# Patient Record
Sex: Male | Born: 1965 | Race: White | Hispanic: No | Smoking: Current every day smoker
Health system: Southern US, Community
[De-identification: ages and names within clinical notes are randomized; demographics above are authoritative.]

## PROBLEM LIST (undated history)

## (undated) DIAGNOSIS — I10 Essential (primary) hypertension: Secondary | ICD-10-CM

## (undated) DIAGNOSIS — E7800 Pure hypercholesterolemia, unspecified: Secondary | ICD-10-CM

---

## 2008-05-31 ENCOUNTER — Emergency Department (HOSPITAL_BASED_OUTPATIENT_CLINIC_OR_DEPARTMENT_OTHER): Admission: EM | Admit: 2008-05-31 | Discharge: 2008-05-31 | Payer: Self-pay | Admitting: Emergency Medicine

## 2008-06-02 ENCOUNTER — Emergency Department (HOSPITAL_BASED_OUTPATIENT_CLINIC_OR_DEPARTMENT_OTHER): Admission: EM | Admit: 2008-06-02 | Discharge: 2008-06-02 | Payer: Self-pay | Admitting: Emergency Medicine

## 2008-06-17 ENCOUNTER — Emergency Department (HOSPITAL_BASED_OUTPATIENT_CLINIC_OR_DEPARTMENT_OTHER): Admission: EM | Admit: 2008-06-17 | Discharge: 2008-06-17 | Payer: Self-pay | Admitting: Emergency Medicine

## 2008-09-23 ENCOUNTER — Ambulatory Visit: Payer: Self-pay | Admitting: Diagnostic Radiology

## 2008-09-23 ENCOUNTER — Emergency Department (HOSPITAL_BASED_OUTPATIENT_CLINIC_OR_DEPARTMENT_OTHER): Admission: EM | Admit: 2008-09-23 | Discharge: 2008-09-23 | Payer: Self-pay | Admitting: Emergency Medicine

## 2009-02-01 ENCOUNTER — Emergency Department (HOSPITAL_BASED_OUTPATIENT_CLINIC_OR_DEPARTMENT_OTHER): Admission: EM | Admit: 2009-02-01 | Discharge: 2009-02-01 | Payer: Self-pay | Admitting: Emergency Medicine

## 2009-02-01 ENCOUNTER — Ambulatory Visit: Payer: Self-pay | Admitting: Diagnostic Radiology

## 2009-06-12 ENCOUNTER — Emergency Department (HOSPITAL_BASED_OUTPATIENT_CLINIC_OR_DEPARTMENT_OTHER): Admission: EM | Admit: 2009-06-12 | Discharge: 2009-06-12 | Payer: Self-pay | Admitting: Emergency Medicine

## 2011-01-06 LAB — RAPID STREP SCREEN (MED CTR MEBANE ONLY): Streptococcus, Group A Screen (Direct): NEGATIVE

## 2011-07-03 LAB — RAPID STREP SCREEN (MED CTR MEBANE ONLY): Streptococcus, Group A Screen (Direct): NEGATIVE

## 2014-01-24 DIAGNOSIS — I10 Essential (primary) hypertension: Secondary | ICD-10-CM | POA: Insufficient documentation

## 2016-03-30 ENCOUNTER — Emergency Department
Admission: EM | Admit: 2016-03-30 | Discharge: 2016-03-30 | Disposition: A | Discharge status: Home / Self Care | Payer: Self-pay | Source: Home / Self Care | Attending: Family Medicine | Admitting: Family Medicine

## 2016-03-30 ENCOUNTER — Encounter: Payer: Self-pay | Admitting: Emergency Medicine

## 2016-03-30 DIAGNOSIS — L723 Sebaceous cyst: Secondary | ICD-10-CM

## 2016-03-30 HISTORY — DX: Essential (primary) hypertension: I10

## 2016-03-30 MED ORDER — DOXYCYCLINE HYCLATE 100 MG PO CAPS: 100.0000 mg | ORAL_CAPSULE | Freq: Two times a day (BID) | ORAL | Status: DC

## 2016-03-30 NOTE — ED Notes (Signed)
Cyst on back x 4 years, Patient had it removed 8 years ago.

## 2016-03-30 NOTE — ED Provider Notes (Signed)
CSN: [id number]     Arrival date & time 03/30/16  1439 History   First MD Initiated Contact with Patient 03/30/16 1507     Chief Complaint  Patient presents with  . Cyst      HPI Comments: Patient complains of a large nontender lump on his upper back.  He states that his girlfriend persuaded him to have it checked.  He states that he had the lesion surgically drained about four years ago but the lesion has recurred.  The history is provided by the patient.    Past Medical History  Diagnosis Date  . Hypertension    History reviewed. No pertinent past surgical history. No family history on file. Social History  Substance Use Topics  . Smoking status: Current Every Day Smoker -- 1.00 packs/day for 35 years    Types: Cigarettes  . Smokeless tobacco: None  . Alcohol Use: Yes    Review of Systems  Constitutional: Negative for fever, chills, diaphoresis, activity change, appetite change and fatigue.  Skin:       Lesion on upper back  All other systems reviewed and are negative.   Allergies  Review of patient's allergies indicates no known allergies.  Home Medications   Prior to Admission medications   Medication Sig Start Date End Date Taking? Authorizing Provider  doxycycline (VIBRAMYCIN) 100 MG capsule Take 1 capsule (100 mg total) by mouth 2 (two) times daily. 03/30/16   Kandra Nicolas, MD   Meds Ordered and Administered this Visit  Medications - No data to display  BP 127/82 mmHg  Pulse 80  Temp(Src) 98.3 F (36.8 C) (Oral)  Ht 6\' 2"  (1.88 m)  Wt 220 lb (99.791 kg)  BMI 28.23 kg/m2  SpO2 96% No data found.   Physical Exam  Constitutional: He is oriented to person, place, and time. He appears well-developed and well-nourished. No distress.  HENT:  Head: Normocephalic.  Mouth/Throat: Oropharynx is clear and moist.  Eyes: Pupils are equal, round, and reactive to light.  Neck: Neck supple.  Cardiovascular: Normal heart sounds.   Pulmonary/Chest: Breath sounds  normal.  Abdominal: There is no tenderness.  Musculoskeletal: He exhibits no edema.  Neurological: He is alert and oriented to person, place, and time.  Skin: Skin is warm and dry.     Upper back reveals a 4cm diameter sebaceous cyst  as noted on diagram.  The lesion is nontender, without overlying erythema, and does not appear to be infected.      ED Course  Procedures Incise and drain cyst Risks and benefits of procedure explained to patient and verbal consent obtained.  Using sterile technique and local anesthesia with 1% lidocaine with epinephrine, cleansed affected area with Betadine and saline. Identified the most fluctuant area of lesion and incised with #11 blade.  Expressed and extracted significant amount of sebaceous material.   Inserted Iodoform gauze packing.  Bandage applied.  Patient tolerated well    MDM   1. Sebaceous cyst    Although cyst does not appear infected, will begin empiric doxycycline 100mg  bid for staph coverage. Leave today's bandage in place until follow-up visit tomorrow.  Keep wound clean and dry.  After tomorrow, return for any signs of infection (or follow-up with family doctor):  Increasing redness, swelling, pain, heat, drainage, etc. For pain, may take Ibuprofen 200mg , 4 tabs every 8 hours with food.    Kandra Nicolas, MD 04/01/16 [phone]

## 2016-03-30 NOTE — Discharge Instructions (Signed)
Leave today's bandage in place until follow-up visit tomorrow.  Keep wound clean and dry.  After tomorrow, return for any signs of infection (or follow-up with family doctor):  Increasing redness, swelling, pain, heat, drainage, etc. For pain, may take Ibuprofen 200mg , 4 tabs every 8 hours with food.    Epidermal Cyst An epidermal cyst is sometimes called a sebaceous cyst, epidermal inclusion cyst, or infundibular cyst. These cysts usually contain a substance that looks "pasty" or "cheesy" and may have a bad smell. This substance is a protein called keratin. Epidermal cysts are usually found on the face, neck, or trunk. They may also occur in the vaginal area or other parts of the genitalia of both men and women. Epidermal cysts are usually small, painless, slow-growing bumps or lumps that move freely under the skin. It is important not to try to pop them. This may cause an infection and lead to tenderness and swelling. CAUSES  Epidermal cysts may be caused by a deep penetrating injury to the skin or a plugged hair follicle, often associated with acne. SYMPTOMS  Epidermal cysts can become inflamed and cause:  Redness.  Tenderness.  Increased temperature of the skin over the bumps or lumps.  Grayish-white, bad smelling material that drains from the bump or lump. DIAGNOSIS  Epidermal cysts are easily diagnosed by your caregiver during an exam. Rarely, a tissue sample (biopsy) may be taken to rule out other conditions that may resemble epidermal cysts. TREATMENT   Epidermal cysts often get better and disappear on their own. They are rarely ever cancerous.  If a cyst becomes infected, it may become inflamed and tender. This may require opening and draining the cyst. Treatment with antibiotics may be necessary. When the infection is gone, the cyst may be removed with minor surgery.  Small, inflamed cysts can often be treated with antibiotics or by injecting steroid medicines.  Sometimes,  epidermal cysts become large and bothersome. If this happens, surgical removal in your caregiver's office may be necessary. HOME CARE INSTRUCTIONS  Only take over-the-counter or prescription medicines as directed by your caregiver.  Take your antibiotics as directed. Finish them even if you start to feel better. SEEK MEDICAL CARE IF:   Your cyst becomes tender, red, or swollen.  Your condition is not improving or is getting worse.  You have any other questions or concerns. MAKE SURE YOU:  Understand these instructions.  Will watch your condition.  Will get help right away if you are not doing well or get worse.   This information is not intended to replace advice given to you by your health care provider. Make sure you discuss any questions you have with your health care provider.   Document Released: 08/19/2004 Document Revised: 12/11/2011 Document Reviewed: 03/27/2011 Elsevier Interactive Patient Education 2016 [place].   Sebaceous Cyst Removal, Care After Refer to this sheet in the next few weeks. These instructions provide you with information about caring for yourself after your procedure. Your health care provider may also give you more specific instructions. Your treatment has been planned according to current medical practices, but problems sometimes occur. Call your health care provider if you have any problems or questions after your procedure. WHAT TO EXPECT AFTER THE PROCEDURE After your procedure, it is common to have:  Soreness in the area where your cyst was removed.  Tightness or itching from your skin sutures. HOME CARE INSTRUCTIONS  Take medicines only as directed by your health care provider.  If you were  prescribed an antibiotic medicine, finish all of it even if you start to feel better.  Use antibiotic ointment as directed by your health care provider. Follow the instructions carefully.  There are many different ways to close and cover an  incision, including stitches (sutures), skin glue, and adhesive strips. Follow your health care provider's instructions about:  Incision care.  Bandage (dressing) changes and removal.  Incision closure removal.  Keep the bandage (dressing) dry until your health care provider says that it can be removed. Take sponge baths only. Ask your health care provider when you can start showering or taking a bath.  After your dressing is off, check your incision every day for signs of infection. Watch for:  Redness, swelling, or pain.  Fluid, blood, or pus.  You can return to your normal activities. Do not do anything that stretches or puts pressure on your incision.  You can return to your normal diet.  Keep all follow-up visits as directed by your health care provider. This is important. SEEK MEDICAL CARE IF:  You have a fever.  Your incision bleeds.  You have redness, swelling, or pain in the incision area.  You have fluid, blood, or pus coming from your incision.  Your cyst comes back after surgery.   This information is not intended to replace advice given to you by your health care provider. Make sure you discuss any questions you have with your health care provider.   Document Released: 10/09/2014 Document Reviewed: 10/09/2014 Elsevier Interactive Patient Education [employer].

## 2016-03-31 ENCOUNTER — Encounter: Payer: Self-pay | Admitting: Emergency Medicine

## 2016-03-31 ENCOUNTER — Emergency Department (INDEPENDENT_AMBULATORY_CARE_PROVIDER_SITE_OTHER)
Admission: EM | Admit: 2016-03-31 | Discharge: 2016-03-31 | Disposition: A | Discharge status: Home / Self Care | Payer: Self-pay | Source: Home / Self Care | Attending: Family Medicine | Admitting: Family Medicine

## 2016-03-31 DIAGNOSIS — Z5189 Encounter for other specified aftercare: Secondary | ICD-10-CM

## 2016-03-31 NOTE — Discharge Instructions (Signed)
Be sure to take your antibiotic as prescribed. You may keep wound clean with warm soap and water.  You may use a warm damp washcloth or heating pad 3-4 times a day and gentle massage to help keep draining as wound heals from inside out.

## 2016-03-31 NOTE — ED Provider Notes (Signed)
CSN: [id number]     Arrival date & time 03/31/16  1642 History   First MD Initiated Contact with Patient 03/31/16 1649     Chief Complaint  Patient presents with  . Wound Check   (Consider location/radiation/quality/duration/timing/severity/associated sxs/prior Treatment) HPI  Daryl Caldwell is a 50 y.o. male presenting to UC for a wound recheck after having a sebaceous cyst I&D yesterday.  Pt had packing placed in the wound and bandage applied.  Pt denies any concerns or side effects from taking the antibiotic, Doxycycline. Denies fever, n/v/d. Pain is minimal.    Past Medical History  Diagnosis Date  . Hypertension    History reviewed. No pertinent past surgical history. History reviewed. No pertinent family history. Social History  Substance Use Topics  . Smoking status: Current Every Day Smoker -- 1.00 packs/day for 35 years    Types: Cigarettes  . Smokeless tobacco: None  . Alcohol Use: Yes    Review of Systems  Constitutional: Negative for fever and chills.  Gastrointestinal: Negative for nausea, vomiting and diarrhea.  Skin: Positive for wound. Negative for color change.    Allergies  Review of patient's allergies indicates no known allergies.  Home Medications   Prior to Admission medications   Medication Sig Start Date End Date Taking? Authorizing Provider  doxycycline (VIBRAMYCIN) 100 MG capsule Take 1 capsule (100 mg total) by mouth 2 (two) times daily. 03/30/16   Kandra Nicolas, MD   Meds Ordered and Administered this Visit  Medications - No data to display  BP 111/71 mmHg  Pulse 76  Temp(Src) 97.9 F (36.6 C) (Oral)  Resp 16  SpO2 98% No data found.   Physical Exam  Constitutional: Daryl Caldwell is oriented to person, place, and time. Daryl Caldwell appears well-developed and well-nourished.  HENT:  Head: Normocephalic and atraumatic.  Eyes: EOM are normal.  Neck: Normal range of motion.  Cardiovascular: Normal rate.   Pulmonary/Chest: Effort normal.   Musculoskeletal: Normal range of motion.  Neurological: Daryl Caldwell is alert and oriented to person, place, and time.  Skin: Skin is warm and dry. There is erythema.  Center of mid back: 2cm area of erythema with mild induration, packing in place. No active bleeding or discharge. Mild tenderness.  Psychiatric: Daryl Caldwell has a normal mood and affect. His behavior is normal.  Nursing note and vitals reviewed.   ED Course  Procedures (including critical care time)  Labs Review Labs Reviewed - No data to display  Imaging Review No results found.    MDM   1. Visit for wound check    Wound appears to be healing well. Packing removed. Antibiotic ointment and bandage re-applied. Home care instructions provided. Encouraged to continue taking antibiotic as prescribed and using warm compresses. F/u as needed if increased pain, redness, fever, or worsening swelling. May need f/u with general surgery or dermatology/cosmetic surgery if cyst comes back.  Patient verbalized understanding and agreement with treatment plan.     Noland Fordyce, PA-C 03/31/16 1715

## 2016-03-31 NOTE — ED Notes (Signed)
Patient here to have wound site on upper/middle back checked and re-dressed.

## 2016-04-26 ENCOUNTER — Emergency Department
Admission: EM | Admit: 2016-04-26 | Discharge: 2016-04-26 | Disposition: A | Discharge status: Home / Self Care | Payer: Self-pay | Source: Home / Self Care | Attending: Family Medicine | Admitting: Family Medicine

## 2016-04-26 ENCOUNTER — Encounter: Payer: Self-pay | Admitting: *Deleted

## 2016-04-26 DIAGNOSIS — J9801 Acute bronchospasm: Secondary | ICD-10-CM

## 2016-04-26 DIAGNOSIS — J069 Acute upper respiratory infection, unspecified: Secondary | ICD-10-CM

## 2016-04-26 DIAGNOSIS — B9789 Other viral agents as the cause of diseases classified elsewhere: Principal | ICD-10-CM

## 2016-04-26 MED ORDER — PREDNISONE 20 MG PO TABS: ORAL_TABLET | ORAL | 0 refills | Status: DC

## 2016-04-26 MED ORDER — BENZONATATE 200 MG PO CAPS: 200.0000 mg | ORAL_CAPSULE | Freq: Every day | ORAL | 0 refills | Status: DC

## 2016-04-26 MED ORDER — DOXYCYCLINE HYCLATE 100 MG PO CAPS: 100.0000 mg | ORAL_CAPSULE | Freq: Two times a day (BID) | ORAL | 0 refills | Status: DC

## 2016-04-26 NOTE — Discharge Instructions (Signed)
Take plain guaifenesin (1200mg  extended release tabs such as Mucinex) twice daily, with plenty of water, for cough and congestion.  May add Pseudoephedrine (30mg , one or two every 4 to 6 hours) for sinus congestion.  Get adequate rest.   May use Afrin nasal spray (or generic oxymetazoline) twice daily for about 5 days and then discontinue.  Also recommend using saline nasal spray several times daily and saline nasal irrigation (AYR is a common brand).  Use Flonase nasal spray each morning after using Afrin nasal spray and saline nasal irrigation. Try warm salt water gargles for sore throat.  Stop all antihistamines for now, and other non-prescription cough/cold preparations.   Follow-up with family doctor if not improving about 7 to10 days.

## 2016-04-26 NOTE — ED Triage Notes (Signed)
Pt c/o chest tightness, HA, and productive cough x 2 days. Denies fever.

## 2016-04-26 NOTE — ED Provider Notes (Signed)
[name]    CSN: [id number] Arrival date & time: 04/26/16  1059  First Provider Contact:  None       History   Chief Complaint Chief Complaint  Patient presents with  . Cough    HPI CARMELO Caldwell is a 50 y.o. male.   Patient complains of three day history of typical cold-like symptoms developing over several days,  including mild sore throat, sinus congestion, headache, fatigue, and cough.  He has a past history of asthma as a child, and has developed occasional wheezing with his present illness.  He has a history of perennial rhinitis for which he takes Zyrtec daily.   He continues to smoke.   The history is provided by the patient.    Past Medical History:  Diagnosis Date  . Hypertension     There are no active problems to display for this patient.   History reviewed. No pertinent surgical history.     Home Medications    Prior to Admission medications   Medication Sig Start Date End Date Taking? Authorizing Provider  benzonatate (TESSALON) 200 MG capsule Take 1 capsule (200 mg total) by mouth at bedtime. Take as needed for cough 04/26/16   Kandra Nicolas, MD  doxycycline (VIBRAMYCIN) 100 MG capsule Take 1 capsule (100 mg total) by mouth 2 (two) times daily. Take with food. 04/26/16   Kandra Nicolas, MD  predniSONE (DELTASONE) 20 MG tablet Take one tab by mouth twice daily for 5 days, then one daily for 3 days. Take with food. 04/26/16   Kandra Nicolas, MD    Family History History reviewed. No pertinent family history.  Social History Social History  Substance Use Topics  . Smoking status: Current Every Day Smoker    Packs/day: 1.00    Years: 35.00    Types: Cigarettes  . Smokeless tobacco: Never Used  . Alcohol use Yes     Allergies   Review of patient's allergies indicates no known allergies.   Review of Systems Review of Systems + sore throat + cough No pleuritic pain + wheezing + nasal congestion + post-nasal  drainage No sinus pain/pressure No itchy/red eyes No earache No hemoptysis No SOB No fever, + chills No nausea No vomiting No abdominal pain No diarrhea No urinary symptoms No skin rash + fatigue + myalgias + headache Used OTC meds without relief   Physical Exam Triage Vital Signs ED Triage Vitals  Enc Vitals Group     BP 04/26/16 1125 113/71     Pulse Rate 04/26/16 1125 71     Resp 04/26/16 1125 18     Temp 04/26/16 1125 98.4 F (36.9 C)     Temp Source 04/26/16 1125 Oral     SpO2 04/26/16 1125 98 %     Weight 04/26/16 1125 224 lb (101.6 kg)     Height 04/26/16 1125 6\' 2"  (1.88 m)     Head Circumference --      Peak Flow --      Pain Score 04/26/16 1126 0     Pain Loc --      Pain Edu? --      Excl. in [place]? --    No data found.   Updated Vital Signs BP 113/71 (BP Location: Left Arm)   Pulse 71   Temp 98.4 F (36.9 C) (Oral)   Resp 18   Ht 6\' 2"  (1.88 m)   Wt 224 lb (101.6 kg)   SpO2  98%   BMI 28.76 kg/m   Visual Acuity Right Eye Distance:   Left Eye Distance:   Bilateral Distance:    Right Eye Near:   Left Eye Near:    Bilateral Near:     Physical Exam Nursing notes and Vital Signs reviewed. Appearance:  Patient appears stated age, and in no acute distress Eyes:  Pupils are equal, round, and reactive to light and accomodation.  Extraocular movement is intact.  Conjunctivae are not inflamed  Ears:  Canals normal (right canal partly occluded with cerumen).  Tympanic membranes normal.  Nose:  Congested turbinates.  No sinus tenderness.   Pharynx:  Normal Neck:  Supple.  Tender enlarged posterior/lateral nodes are palpated bilaterally  Lungs:  Clear to auscultation.  Breath sounds are equal.  Moving air well. Heart:  Regular rate and rhythm without murmurs, rubs, or gallops.  Abdomen:  Nontender without masses or hepatosplenomegaly.  Bowel sounds are present.  No CVA or flank tenderness.  Extremities:  No edema.  Skin:  No rash present.    UC  Treatments / Results  Labs (all labs ordered are listed, but only abnormal results are displayed) Labs Reviewed - No data to display  EKG  EKG Interpretation None       Radiology No results found.  Procedures Procedures (including critical care time)  Medications Ordered in UC Medications - No data to display   Initial Impression / Assessment and Plan / UC Course  I have reviewed the triage vital signs and the nursing notes.  Pertinent labs & imaging results that were available during my care of the patient were reviewed by me and considered in my medical decision making (see chart for details).  Clinical Course       Final Clinical Impressions(s) / UC Diagnoses   Final diagnoses:  Viral URI with cough  Bronchospasm  With patient's past history of asthma and present perennial rhinitis, would benefit from prednisone burst/taper.  Begin doxycycline for atypical coverage. Prescription written for Benzonatate [facility]) to take at bedtime for night-time cough.   Take plain guaifenesin (1200mg  extended release tabs such as Mucinex) twice daily, with plenty of water, for cough and congestion.  May add Pseudoephedrine (30mg , one or two every 4 to 6 hours) for sinus congestion.  Get adequate rest.   May use Afrin nasal spray (or generic oxymetazoline) twice daily for about 5 days and then discontinue.  Also recommend using saline nasal spray several times daily and saline nasal irrigation (AYR is a common brand).  Use Flonase nasal spray each morning after using Afrin nasal spray and saline nasal irrigation. Try warm salt water gargles for sore throat.  Stop all antihistamines for now, and other non-prescription cough/cold preparations.   Follow-up with family doctor if not improving about 7 to10 days.   New Prescriptions New Prescriptions   BENZONATATE (TESSALON) 200 MG CAPSULE    Take 1 capsule (200 mg total) by mouth at bedtime. Take as needed for cough   DOXYCYCLINE  (VIBRAMYCIN) 100 MG CAPSULE    Take 1 capsule (100 mg total) by mouth 2 (two) times daily. Take with food.   PREDNISONE (DELTASONE) 20 MG TABLET    Take one tab by mouth twice daily for 5 days, then one daily for 3 days. Take with food.     Kandra Nicolas, MD 04/26/16 [phone]

## 2016-06-10 ENCOUNTER — Emergency Department
Admission: EM | Admit: 2016-06-10 | Discharge: 2016-06-10 | Disposition: A | Discharge status: Home / Self Care | Payer: Self-pay | Source: Home / Self Care | Attending: Family Medicine | Admitting: Family Medicine

## 2016-06-10 ENCOUNTER — Emergency Department (INDEPENDENT_AMBULATORY_CARE_PROVIDER_SITE_OTHER): Payer: Self-pay

## 2016-06-10 ENCOUNTER — Encounter: Payer: Self-pay | Admitting: Emergency Medicine

## 2016-06-10 DIAGNOSIS — M25562 Pain in left knee: Secondary | ICD-10-CM

## 2016-06-10 DIAGNOSIS — M1712 Unilateral primary osteoarthritis, left knee: Secondary | ICD-10-CM

## 2016-06-10 DIAGNOSIS — M25462 Effusion, left knee: Secondary | ICD-10-CM

## 2016-06-10 MED ORDER — MELOXICAM 7.5 MG PO TABS: ORAL_TABLET | ORAL | 0 refills | Status: DC

## 2016-06-10 NOTE — ED Triage Notes (Signed)
Patient states that he is having left knee pain as a result of either twisting the knee or wearing old uneven boots on Thursday, History of left knee surgery, torn meniscus.

## 2016-06-10 NOTE — ED Provider Notes (Signed)
CSN: [id number]     Arrival date & time 06/10/16  0931 History   First MD Initiated Contact with Patient 06/10/16 [phone]     Chief Complaint  Patient presents with  . Knee Pain   (Consider location/radiation/quality/duration/timing/severity/associated sxs/prior Treatment) HPI  Daryl Caldwell is a 50 y.o. male presenting to UC with c/o Left knee pain and swelling to Medial aspect that started about 2 days ago.  He cannot recall a specific injury but states he did twist it the other day but has also been wearing old uneven boots when pain started.  He reports hx of Left knee surgery for a torn meniscus about 5 years ago.  Pain is aching and throbbing, 5/10, worse with ambulation and palpation.  He did take ibuprofen today with some relief.     Past Medical History:  Diagnosis Date  . Hypertension    History reviewed. No pertinent surgical history. History reviewed. No pertinent family history. Social History  Substance Use Topics  . Smoking status: Current Every Day Smoker    Packs/day: 1.00    Years: 35.00    Types: Cigarettes  . Smokeless tobacco: Never Used  . Alcohol use Yes    Review of Systems  Musculoskeletal: Positive for arthralgias, joint swelling and myalgias. Negative for gait problem.  Skin: Negative for color change and wound.  Neurological: Negative for weakness and numbness.    Allergies  Review of patient's allergies indicates no known allergies.  Home Medications   Prior to Admission medications   Medication Sig Start Date End Date Taking? Authorizing Provider  aspirin 81 MG chewable tablet Chew by mouth daily.   Yes Historical Provider, MD  lisinopril (PRINIVIL,ZESTRIL) 20 MG tablet Take 20 mg by mouth daily.   Yes Historical Provider, MD  metoprolol succinate (TOPROL-XL) 50 MG 24 hr tablet Take 50 mg by mouth daily. Take with or immediately following a meal.   Yes Historical Provider, MD  benzonatate (TESSALON) 200 MG capsule Take 1 capsule (200 mg total) by  mouth at bedtime. Take as needed for cough 04/26/16   Kandra Nicolas, MD  doxycycline (VIBRAMYCIN) 100 MG capsule Take 1 capsule (100 mg total) by mouth 2 (two) times daily. Take with food. 04/26/16   Kandra Nicolas, MD  meloxicam (MOBIC) 7.5 MG tablet Take 2 tabs daily for 5 days, then 1-2 tabs daily as needed for pain. 06/10/16   Noland Fordyce, PA-C  predniSONE (DELTASONE) 20 MG tablet Take one tab by mouth twice daily for 5 days, then one daily for 3 days. Take with food. 04/26/16   Kandra Nicolas, MD   Meds Ordered and Administered this Visit  Medications - No data to display  BP 119/80 (BP Location: Left Arm)   Pulse 66   Temp 97.7 F (36.5 C) (Oral)   Resp 16   Ht 6\' 2"  (1.88 m)   Wt 233 lb 5 oz (105.8 kg)   SpO2 98%   BMI 29.96 kg/m  No data found.   Physical Exam  Constitutional: He is oriented to person, place, and time. He appears well-developed and well-nourished. No distress.  HENT:  Head: Normocephalic and atraumatic.  Eyes: EOM are normal.  Neck: Normal range of motion.  Cardiovascular: Normal rate.   Pulmonary/Chest: Effort normal.  Musculoskeletal: Normal range of motion. He exhibits edema and tenderness. He exhibits no deformity.  Left knee: mild edema to medial superior aspect, tender. Full ROM. No crepitus. No tenderness to joint line spaces.  No posterior tenderness. Calf is soft, non-tender.  Neurological: He is alert and oriented to person, place, and time.  Skin: Skin is warm and dry. No rash noted. He is not diaphoretic. No erythema.  Psychiatric: He has a normal mood and affect. His behavior is normal.  Nursing note and vitals reviewed.   Urgent Care Course   Clinical Course    Procedures (including critical care time)  Labs Review Labs Reviewed - No data to display  Imaging Review Dg Knee Complete 4 Views Left  Result Date: 06/10/2016 CLINICAL DATA:  50 year old male with a history of left knee pain EXAM: LEFT KNEE - COMPLETE 4+ VIEW  COMPARISON:  None. FINDINGS: No acute displaced fracture. Evidence of joint effusion on the lateral view. Early tricompartmental osteoarthritis. Degenerative changes versus remote injury at the proximal fibula IMPRESSION: No acute bony abnormality. Evidence of small joint effusion on the lateral view. Early tricompartmental osteoarthritis. Signed, Dulcy Fanny. Earleen Newport, DO [facility] Electronically Signed   By: Corrie Mckusick D.O.   On: 06/10/2016 10:35     MDM   1. Left knee pain   2. Left medial knee pain   3. Knee swelling, left   4. Knee effusion, left    Pt c/o Left knee pain and swelling. No specific injury. Hx of knee surgery 5 years ago.  Mild edema to medial aspect noted. No joint tenderness. Plain films: evidence of small joint effusion on lateral view, no acute bony abnormality.  Evidence of early tricompartmental osteoarthritis.  Knee brace provided for comfort. Home care instructions provided. Encouraged ice and elevation. Rx: Meloxicam  Encouraged f/u with Sports Medicine or PCP in 1 week if not improving.     Noland Fordyce, PA-C 06/10/16 1049

## 2016-06-10 NOTE — Discharge Instructions (Signed)
°  Meloxicam (Mobic) is an antiinflammatory to help with pain and inflammation.  Do not take ibuprofen, Advil, Aleve, or any other medications that contain NSAIDs while taking meloxicam as this may cause stomach upset or even ulcers if taken in large amounts for an extended period of time.  ° °

## 2016-06-26 ENCOUNTER — Ambulatory Visit (INDEPENDENT_AMBULATORY_CARE_PROVIDER_SITE_OTHER): Payer: Self-pay | Admitting: Sports Medicine

## 2016-06-26 ENCOUNTER — Encounter: Payer: Self-pay | Admitting: Sports Medicine

## 2016-06-26 DIAGNOSIS — M1712 Unilateral primary osteoarthritis, left knee: Secondary | ICD-10-CM

## 2016-06-26 NOTE — Assessment & Plan Note (Signed)
Aspiration and injection as above. Return in one month. 

## 2016-06-26 NOTE — Progress Notes (Addendum)
Subjective:    I'm seeing this patient as a consultation for:  Dr. Nickolas Madrid   CC: L knee pain   HPI: 50 yo with history of L knee arthroscopy 7 years ago presenting with two weeks of L medial knee pain.  He says he was in his normal state of health until two weeks ago when he woke up with "throbbing" medial knee pain.  No trauma or injury.  Knee was swollen and it was difficult to walk on.  He went to urgent care, where X-rays showed "evidence of small joint effusion on lateral view, no acute bony abnormality.  Evidence of early tricompartmental osteoarthritis."  Since then, he has been wearing a knee brace every day and taking Meloxicam daily - both of which have helped with the pain.  He says pain waxes and wanes throughout the day, but is worse when walking.  He continues to notice swelling.  He also notices some popping and catching of his knee, which he had prior to this injury but have become more frequent.  Denies any radicular symptoms.    Past medical history:  Negative.  See flowsheet/record as well for more information.  Surgical history: Negative.  See flowsheet/record as well for more information.  Family history: Negative.  See flowsheet/record as well for more information.  Social history: Negative.  See flowsheet/record as well for more information.  Allergies, and medications have been entered into the medical record, reviewed, and no changes needed.   Review of Systems: No headache, visual changes, nausea, vomiting, diarrhea, constipation, dizziness, abdominal pain, skin rash, fevers, chills, night sweats, weight loss, swollen lymph nodes, body aches, joint swelling, muscle aches, chest pain, shortness of breath, mood changes, visual or auditory hallucinations.   Objective:   General: Well Developed, well nourished, and in no acute distress.  Neuro/Psych: Alert and oriented x3, extra-ocular muscles intact, able to move all 4 extremities, sensation grossly  intact. Skin: Warm and dry, no rashes noted.  Respiratory: Not using accessory muscles, speaking in full sentences, trachea midline.  Cardiovascular: Pulses palpable, no extremity edema. Abdomen: Does not appear distended. L Knee: Mild effusion and swelling of L knee.   TTP over medial joint line.  ROM full in flexion and extension and lower leg rotation. Ligaments with solid consistent endpoints including ACL, PCL, LCL, MCL. Negative Mcmurray's, Apley's, and Thessalonian tests. Non painful patellar compression. Patellar glide without crepitus. Patellar and quadriceps tendons unremarkable. Hamstring and quadriceps strength is normal.   Procedure: Real-time Ultrasound Guided aspiration/injection of left knee Device: GE Logiq E  Verbal informed consent obtained.  Time-out conducted.  Noted no overlying erythema, induration, or other signs of local infection.  Skin prepped in a sterile fashion.  Local anesthesia: Topical Ethyl chloride.  With sterile technique and under real time ultrasound guidance:  18-gauge needle advanced into the patellar recess, aspirated about 20 mL straw-colored fluid, syringe switched and 1 mL kenalog 40, 2 mL lidocaine, 2 mL Marcaine injected easily. Completed without difficulty  Pain immediately resolved suggesting accurate placement of the medication.  Advised to call if fevers/chills, erythema, induration, drainage, or persistent bleeding.  Images permanently stored and available for review in the ultrasound unit.  Impression: Technically successful ultrasound guided injection.  Impression and Recommendations:   This case required medical decision making of moderate complexity.  1. L knee OA: medial joint line tenderness and X-rays that show early tricompartmental osteoarthritis.  Not improving on Meloxicam. -Steroid injection to L knee today  -  Continue Meloxicam daily -PT at-home exercises -Return in 1 month for follow-up  I have seen and examined  the below patient, I agree with the med student's findings, assessment, and plan. ___________________________________________ Gwen Her. Dianah Field, M.D., ABFM., CAQSM. Primary Care and [place] Instructor of [place] of [facility] of Medicine

## 2016-07-24 ENCOUNTER — Ambulatory Visit (INDEPENDENT_AMBULATORY_CARE_PROVIDER_SITE_OTHER): Payer: Self-pay | Admitting: Sports Medicine

## 2016-07-24 ENCOUNTER — Encounter: Payer: Self-pay | Admitting: Sports Medicine

## 2016-07-24 DIAGNOSIS — M1712 Unilateral primary osteoarthritis, left knee: Secondary | ICD-10-CM

## 2016-07-24 MED ORDER — MELOXICAM 15 MG PO TABS: 15.0000 mg | ORAL_TABLET | Freq: Every day | ORAL | 3 refills | Status: DC

## 2016-07-24 NOTE — Progress Notes (Signed)
  Subjective:    CC: Follow-up  HPI: Knee osteoarthritis: Partial improvement with aspiration and injection, wondering what the next step is, still has some pain, not really using his anti-inflammatories, pain is localized at the medial joint line, mild, persistent without radiation, no mechanical symptoms.  Past medical history:  Negative.  See flowsheet/record as well for more information.  Surgical history: Negative.  See flowsheet/record as well for more information.  Family history: Negative.  See flowsheet/record as well for more information.  Social history: Negative.  See flowsheet/record as well for more information.  Allergies, and medications have been entered into the medical record, reviewed, and no changes needed.   Review of Systems: No fevers, chills, night sweats, weight loss, chest pain, or shortness of breath.   Objective:    General: Well Developed, well nourished, and in no acute distress.  Neuro: Alert and oriented x3, extra-ocular muscles intact, sensation grossly intact.  HEENT: Normocephalic, atraumatic, pupils equal round reactive to light, neck supple, no masses, no lymphadenopathy, thyroid nonpalpable.  Skin: Warm and dry, no rashes. Cardiac: Regular rate and rhythm, no murmurs rubs or gallops, no lower extremity edema.  Respiratory: Clear to auscultation bilaterally. Not using accessory muscles, speaking in full sentences. Left Knee: Only minimally swollen with mild tenderness at the medial joint line. ROM normal in flexion and extension and lower leg rotation. Ligaments with solid consistent endpoints including ACL, PCL, LCL, MCL. Negative Mcmurray's and provocative meniscal tests. Non painful patellar compression. Patellar and quadriceps tendons unremarkable. Hamstring and quadriceps strength is normal.  Impression and Recommendations:    Primary osteoarthritis of left knee Needs to take meloxicam, did get partial relief from aspiration and  injection. If insufficient relief after a couple of weeks of meloxicam we will proceed with viscous supplementation. He has no insurance and agrees to simply pay out of pocket. Continue knee brace. He will establish with one of our primary care providers to discuss weight loss medication as well.

## 2016-07-24 NOTE — Assessment & Plan Note (Signed)
Needs to take meloxicam, did get partial relief from aspiration and injection. If insufficient relief after a couple of weeks of meloxicam we will proceed with viscous supplementation. He has no insurance and agrees to simply pay out of pocket. Continue knee brace. He will establish with one of our primary care providers to discuss weight loss medication as well.

## 2016-11-20 ENCOUNTER — Other Ambulatory Visit: Payer: Self-pay | Admitting: Sports Medicine

## 2016-11-20 DIAGNOSIS — M1712 Unilateral primary osteoarthritis, left knee: Secondary | ICD-10-CM

## 2017-03-26 ENCOUNTER — Other Ambulatory Visit: Payer: Self-pay | Admitting: Sports Medicine

## 2017-03-26 DIAGNOSIS — M1712 Unilateral primary osteoarthritis, left knee: Secondary | ICD-10-CM

## 2017-08-21 ENCOUNTER — Other Ambulatory Visit: Payer: Self-pay | Admitting: Sports Medicine

## 2017-08-21 DIAGNOSIS — M1712 Unilateral primary osteoarthritis, left knee: Secondary | ICD-10-CM

## 2017-12-22 ENCOUNTER — Other Ambulatory Visit: Payer: Self-pay | Admitting: Sports Medicine

## 2017-12-22 DIAGNOSIS — M1712 Unilateral primary osteoarthritis, left knee: Secondary | ICD-10-CM

## 2018-01-08 ENCOUNTER — Other Ambulatory Visit: Payer: Self-pay | Admitting: Sports Medicine

## 2018-01-08 DIAGNOSIS — M1712 Unilateral primary osteoarthritis, left knee: Secondary | ICD-10-CM

## 2018-10-29 ENCOUNTER — Ambulatory Visit (INDEPENDENT_AMBULATORY_CARE_PROVIDER_SITE_OTHER): Payer: No Typology Code available for payment source | Admitting: Sports Medicine

## 2018-10-29 ENCOUNTER — Ambulatory Visit (INDEPENDENT_AMBULATORY_CARE_PROVIDER_SITE_OTHER): Payer: No Typology Code available for payment source

## 2018-10-29 ENCOUNTER — Encounter: Payer: Self-pay | Admitting: Sports Medicine

## 2018-10-29 DIAGNOSIS — M11261 Other chondrocalcinosis, right knee: Secondary | ICD-10-CM | POA: Diagnosis not present

## 2018-10-29 DIAGNOSIS — M7712 Lateral epicondylitis, left elbow: Secondary | ICD-10-CM

## 2018-10-29 DIAGNOSIS — M11269 Other chondrocalcinosis, unspecified knee: Secondary | ICD-10-CM | POA: Insufficient documentation

## 2018-10-29 DIAGNOSIS — M170 Bilateral primary osteoarthritis of knee: Secondary | ICD-10-CM

## 2018-10-29 DIAGNOSIS — M1711 Unilateral primary osteoarthritis, right knee: Secondary | ICD-10-CM | POA: Diagnosis not present

## 2018-10-29 MED ORDER — CELECOXIB 200 MG PO CAPS: ORAL_CAPSULE | ORAL | 2 refills | Status: DC

## 2018-10-29 NOTE — Progress Notes (Addendum)
Subjective:    CC: Right knee pain, left elbow pain  HPI: Right knee: History of left knee osteoarthritis, more recently has had increasing pain and swelling, posterior medial joint line, significant swelling and effusion without mechanical symptoms in the right knee.  Moderate, persistent without radiation.  Left elbow pain: Localized over the lateral epicondyle, worse with gripping motions, no trauma, no change in activity level.  I reviewed the past medical history, family history, social history, surgical history, and allergies today and no changes were needed.  Please see the problem list section below in epic for further details.  Past Medical History: Past Medical History:  Diagnosis Date  . Hypertension    Past Surgical History: No past surgical history on file. Social History: Social History   Socioeconomic History  . Marital status: Legally Separated    Spouse name: Not on file  . Number of children: Not on file  . Years of education: Not on file  . Highest education level: Not on file  Occupational History  . Not on file  Social Needs  . Financial resource strain: Not on file  . Food insecurity:    Worry: Not on file    Inability: Not on file  . Transportation needs:    Medical: Not on file    Non-medical: Not on file  Tobacco Use  . Smoking status: Current Every Day Smoker    Packs/day: 1.00    Years: 35.00    Pack years: 35.00    Types: Cigarettes  . Smokeless tobacco: Never Used  Substance and Sexual Activity  . Alcohol use: Yes  . Drug use: No  . Sexual activity: Not on file  Lifestyle  . Physical activity:    Days per week: Not on file    Minutes per session: Not on file  . Stress: Not on file  Relationships  . Social connections:    Talks on phone: Not on file    Gets together: Not on file    Attends religious service: Not on file    Active member of club or organization: Not on file    Attends meetings of clubs or organizations: Not on  file    Relationship status: Not on file  Other Topics Concern  . Not on file  Social History Narrative  . Not on file   Family History: No family history on file. Allergies: No Known Allergies Medications: See med rec.  Review of Systems: No fevers, chills, night sweats, weight loss, chest pain, or shortness of breath.   Objective:    General: Well Developed, well nourished, and in no acute distress.  Neuro: Alert and oriented x3, extra-ocular muscles intact, sensation grossly intact.  HEENT: Normocephalic, atraumatic, pupils equal round reactive to light, neck supple, no masses, no lymphadenopathy, thyroid nonpalpable.  Skin: Warm and dry, no rashes. Cardiac: Regular rate and rhythm, no murmurs rubs or gallops, no lower extremity edema.  Respiratory: Clear to auscultation bilaterally. Not using accessory muscles, speaking in full sentences. Right knee: Visibly swollen, palpable fluid wave with effusion, tenderness at the posterior medial joint line. ROM normal in flexion and extension and lower leg rotation. Ligaments with solid consistent endpoints including ACL, PCL, LCL, MCL. Negative Mcmurray's and provocative meniscal tests. Non painful patellar compression. Patellar and quadriceps tendons unremarkable. Hamstring and quadriceps strength is normal. Left elbow: Unremarkable to inspection. Range of motion full pronation, supination, flexion, extension. Strength is full to all of the above directions Stable to varus, valgus stress.  Negative moving valgus stress test. Tender palpation of the, extensor tendon origin. Ulnar nerve does not sublux. Negative cubital tunnel Tinel's.  Procedure: Real-time Ultrasound Guided aspiration/injection of right knee Device: GE Logiq E  Verbal informed consent obtained.  Time-out conducted.  Noted no overlying erythema, induration, or other signs of local infection.  Skin prepped in a sterile fashion.  Local anesthesia: Topical Ethyl  chloride.  With sterile technique and under real time ultrasound guidance: Using an 18-gauge needle aspirated approximately 20 cc of slightly cloudy, straw-colored fluid, syringe switched and 1 cc Kenalog 40, 2 cc lidocaine, 2 cc bupivacaine injected easily. Completed without difficulty  Pain immediately resolved suggesting accurate placement of the medication.  Advised to call if fevers/chills, erythema, induration, drainage, or persistent bleeding.  Images permanently stored and available for review in the ultrasound unit.  Impression: Technically successful ultrasound guided injection.  Impression and Recommendations:    Pseudogout and primary osteoarthritis of both knees Osteoarthritis with an effusion. Aspiration and injection. Fluid was slightly cloudy, adding crystal analysis. X-rays. Celebrex. Rehab exercises given. Return to see me in 6 weeks.  Crystal analysis shows calcium pyrophosphate crystals confirming the diagnosis of pseudogout, for further flares we can use colchicine.  Lateral epicondylitis, left elbow We are going to start with rehab exercises, tennis elbow brace, return in 6 weeks, injection if no better. ___________________________________________ Gwen Her. Dianah Field, M.D., ABFM., CAQSM. Primary Care and Sports Medicine [place] [facility]  Adjunct Professor of [employer] [facility] of Medicine

## 2018-10-29 NOTE — Assessment & Plan Note (Signed)
We are going to start with rehab exercises, tennis elbow brace, return in 6 weeks, injection if no better.

## 2018-10-29 NOTE — Assessment & Plan Note (Addendum)
Osteoarthritis with an effusion. Aspiration and injection. Fluid was slightly cloudy, adding crystal analysis. X-rays. Celebrex. Rehab exercises given. Return to see me in 6 weeks.  Crystal analysis shows calcium pyrophosphate crystals confirming the diagnosis of pseudogout, for further flares we can use colchicine.

## 2018-10-30 LAB — CELL COUNT + DIFF, W/O CRYST-SYNVL FLD
Basophils, %: 0 %
Eosinophils-Synovial: 0 % (ref 0–2)
Lymphocytes-Synovial Fld: 27 % (ref 0–74)
Monocyte/Macrophage: 64 % (ref 0–69)
Neutrophil, Synovial: 9 % (ref 0–24)
Synoviocytes, %: 0 % (ref 0–15)
WBC, Synovial: 193 cells/uL — ABNORMAL HIGH (ref <150)

## 2018-10-30 LAB — SYNOVIAL FLUID, CRYSTAL

## 2018-12-03 ENCOUNTER — Encounter: Payer: Self-pay | Admitting: Sports Medicine

## 2018-12-03 ENCOUNTER — Ambulatory Visit (INDEPENDENT_AMBULATORY_CARE_PROVIDER_SITE_OTHER): Payer: No Typology Code available for payment source | Admitting: Sports Medicine

## 2018-12-03 DIAGNOSIS — M7712 Lateral epicondylitis, left elbow: Secondary | ICD-10-CM

## 2018-12-03 DIAGNOSIS — M11261 Other chondrocalcinosis, right knee: Secondary | ICD-10-CM | POA: Diagnosis not present

## 2018-12-03 MED ORDER — COLCHICINE 0.6 MG PO TABS: ORAL_TABLET | ORAL | 2 refills | Status: DC

## 2018-12-03 NOTE — Progress Notes (Signed)
  Subjective:    CC: Follow-up  HPI: Right knee pseudogout: For the most part resolved after aspirating her injection.  Left lateral epicondylitis, resolved.  I reviewed the past medical history, family history, social history, surgical history, and allergies today and no changes were needed.  Please see the problem list section below in epic for further details.  Past Medical History: Past Medical History:  Diagnosis Date  . Hypertension    Past Surgical History: No past surgical history on file. Social History: Social History   Socioeconomic History  . Marital status: Legally Separated    Spouse name: Not on file  . Number of children: Not on file  . Years of education: Not on file  . Highest education level: Not on file  Occupational History  . Not on file  Social Needs  . Financial resource strain: Not on file  . Food insecurity:    Worry: Not on file    Inability: Not on file  . Transportation needs:    Medical: Not on file    Non-medical: Not on file  Tobacco Use  . Smoking status: Current Every Day Smoker    Packs/day: 1.00    Years: 35.00    Pack years: 35.00    Types: Cigarettes  . Smokeless tobacco: Never Used  Substance and Sexual Activity  . Alcohol use: Yes  . Drug use: No  . Sexual activity: Not on file  Lifestyle  . Physical activity:    Days per week: Not on file    Minutes per session: Not on file  . Stress: Not on file  Relationships  . Social connections:    Talks on phone: Not on file    Gets together: Not on file    Attends religious service: Not on file    Active member of club or organization: Not on file    Attends meetings of clubs or organizations: Not on file    Relationship status: Not on file  Other Topics Concern  . Not on file  Social History Narrative  . Not on file   Family History: No family history on file. Allergies: No Known Allergies Medications: See med rec.  Review of Systems: No fevers, chills, night  sweats, weight loss, chest pain, or shortness of breath.   Objective:    General: Well Developed, well nourished, and in no acute distress.  Neuro: Alert and oriented x3, extra-ocular muscles intact, sensation grossly intact.  HEENT: Normocephalic, atraumatic, pupils equal round reactive to light, neck supple, no masses, no lymphadenopathy, thyroid nonpalpable.  Skin: Warm and dry, no rashes. Cardiac: Regular rate and rhythm, no murmurs rubs or gallops, no lower extremity edema.  Respiratory: Clear to auscultation bilaterally. Not using accessory muscles, speaking in full sentences.  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 Continue Celebrex as needed, and some colchicine to use as needed for flares. Return as needed.  Lateral epicondylitis, left elbow Resolved with conservative measures. ___________________________________________ Gwen Her. Dianah Field, M.D., ABFM., CAQSM. Primary Care and Sports Medicine [place] [facility]  Adjunct Professor of [place] of [facility] of Medicine

## 2018-12-03 NOTE — Assessment & Plan Note (Signed)
Resolved with conservative measures. 

## 2018-12-03 NOTE — Assessment & Plan Note (Signed)
Doing much better after aspiration and injection, crystal analysis did show calcium pyrophosphate crystals. Continue Celebrex as needed, and some colchicine to use as needed for flares. Return as needed.

## 2018-12-25 ENCOUNTER — Other Ambulatory Visit: Payer: Self-pay

## 2018-12-25 ENCOUNTER — Encounter: Payer: Self-pay | Admitting: Sports Medicine

## 2018-12-25 ENCOUNTER — Ambulatory Visit (INDEPENDENT_AMBULATORY_CARE_PROVIDER_SITE_OTHER): Payer: No Typology Code available for payment source | Admitting: Sports Medicine

## 2018-12-25 DIAGNOSIS — M25561 Pain in right knee: Secondary | ICD-10-CM

## 2018-12-25 DIAGNOSIS — M11269 Other chondrocalcinosis, unspecified knee: Secondary | ICD-10-CM | POA: Diagnosis not present

## 2018-12-25 DIAGNOSIS — M25562 Pain in left knee: Secondary | ICD-10-CM

## 2018-12-25 DIAGNOSIS — G8929 Other chronic pain: Secondary | ICD-10-CM

## 2018-12-25 NOTE — Assessment & Plan Note (Signed)
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 Continue Celebrex 2 pills daily. Proceeding with MRI of both knees for surgical planning. I would also like to get him approved for Orthovisc. Ice the knee 20 minutes 3-4 times a day.

## 2018-12-25 NOTE — Progress Notes (Signed)
Subjective:    CC: Knee pain and swelling  HPI: This is a pleasant 53 year old male, he has osteoarthritis in both knees, he is post left partial meniscectomy, we have also done aspirations and injections with crystal analyses that showed calcium pyrophosphate crystals.  More recently he has had increasing pain, swelling in his right knee, localized to the medial joint line, no mechanical symptoms, simply persisted.  He has done Celebrex 400 mg daily, he has done colchicine 1 tab daily, no improvement in pain.  Symptoms are moderate, worsening.  I reviewed the past medical history, family history, social history, surgical history, and allergies today and no changes were needed.  Please see the problem list section below in epic for further details.  Past Medical History: Past Medical History:  Diagnosis Date  . Hypertension    Past Surgical History: No past surgical history on file. Social History: Social History   Socioeconomic History  . Marital status: Legally Separated    Spouse name: Not on file  . Number of children: Not on file  . Years of education: Not on file  . Highest education level: Not on file  Occupational History  . Not on file  Social Needs  . Financial resource strain: Not on file  . Food insecurity:    Worry: Not on file    Inability: Not on file  . Transportation needs:    Medical: Not on file    Non-medical: Not on file  Tobacco Use  . Smoking status: Current Every Day Smoker    Packs/day: 1.00    Years: 35.00    Pack years: 35.00    Types: Cigarettes  . Smokeless tobacco: Never Used  Substance and Sexual Activity  . Alcohol use: Yes  . Drug use: No  . Sexual activity: Not on file  Lifestyle  . Physical activity:    Days per week: Not on file    Minutes per session: Not on file  . Stress: Not on file  Relationships  . Social connections:    Talks on phone: Not on file    Gets together: Not on file    Attends religious service: Not on file     Active member of club or organization: Not on file    Attends meetings of clubs or organizations: Not on file    Relationship status: Not on file  Other Topics Concern  . Not on file  Social History Narrative  . Not on file   Family History: No family history on file. Allergies: No Known Allergies Medications: See med rec.  Review of Systems: No fevers, chills, night sweats, weight loss, chest pain, or shortness of breath.   Objective:    General: Well Developed, well nourished, and in no acute distress.  Neuro: Alert and oriented x3, extra-ocular muscles intact, sensation grossly intact.  HEENT: Normocephalic, atraumatic, pupils equal round reactive to light, neck supple, no masses, no lymphadenopathy, thyroid nonpalpable.  Skin: Warm and dry, no rashes. Cardiac: Regular rate and rhythm, no murmurs rubs or gallops, no lower extremity edema.  Respiratory: Clear to auscultation bilaterally. Not using accessory muscles, speaking in full sentences. Bilateral knees: Right knee is fairly swollen, both knees are tender to palpation at the medial joint line. ROM normal in flexion and extension and lower leg rotation. Ligaments with solid consistent endpoints including ACL, PCL, LCL, MCL. Negative Mcmurray's and provocative meniscal tests. Non painful patellar compression. Patellar and quadriceps tendons unremarkable. Hamstring and quadriceps strength is normal.  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 Continue Celebrex 2 pills daily. Proceeding with MRI of both knees for surgical planning. I would also like to get him approved for Orthovisc. Ice the knee 20 minutes 3-4 times a day.   ___________________________________________ Gwen Her. Dianah Field, M.D., ABFM., CAQSM. Primary Care  and Sports Medicine [facility]  Adjunct Professor of [place] [employer]

## 2018-12-25 NOTE — Patient Instructions (Signed)
Increase the colchicine to 2 pills daily for a week and then drop back down to 1 pill daily. Continue Celebrex 2 pills daily.

## 2018-12-26 ENCOUNTER — Telehealth: Payer: Self-pay | Admitting: Sports Medicine

## 2018-12-26 NOTE — Telephone Encounter (Signed)
-----   Message from Daryl Caldwell, [place] sent at 12/26/2018 10:57 AM EDT ----- Information has been submitted to Orthovisc and awaiting determination.   ----- Message ----- From: Silverio Decamp, MD Sent: 12/25/2018   9:36 AM EDT To: Daryl Caldwell, CMA  Orthovisc approval please, both knees, x-ray confirmed, failed steroid injections. ___________________________________________Thomas J. Dianah Field, M.D., ABFM., CAQSM.Primary Care and Sports MedicineC[facility] MedCenter KernersvilleAdjunct Professor of [place] of [facility] of Medicine

## 2018-12-30 ENCOUNTER — Ambulatory Visit (INDEPENDENT_AMBULATORY_CARE_PROVIDER_SITE_OTHER): Payer: No Typology Code available for payment source

## 2018-12-30 ENCOUNTER — Other Ambulatory Visit: Payer: Self-pay

## 2018-12-30 DIAGNOSIS — G8929 Other chronic pain: Secondary | ICD-10-CM

## 2018-12-30 DIAGNOSIS — M23222 Derangement of posterior horn of medial meniscus due to old tear or injury, left knee: Secondary | ICD-10-CM | POA: Diagnosis not present

## 2018-12-30 DIAGNOSIS — M1712 Unilateral primary osteoarthritis, left knee: Secondary | ICD-10-CM | POA: Diagnosis not present

## 2018-12-30 DIAGNOSIS — M25561 Pain in right knee: Secondary | ICD-10-CM

## 2018-12-30 DIAGNOSIS — M25562 Pain in left knee: Secondary | ICD-10-CM

## 2018-12-30 DIAGNOSIS — M23221 Derangement of posterior horn of medial meniscus due to old tear or injury, right knee: Secondary | ICD-10-CM | POA: Diagnosis not present

## 2018-12-30 DIAGNOSIS — M11269 Other chondrocalcinosis, unspecified knee: Secondary | ICD-10-CM

## 2019-01-07 NOTE — Telephone Encounter (Signed)
Orthovisc is not covered. Call reference number [id number]. Attempted to call patient and the number is not correct to let patient know injections are not covered.

## 2019-01-09 NOTE — Telephone Encounter (Signed)
I do not have a preference which Visco supplement we use, if Synvisc is the preferred agent then try that.

## 2019-01-09 NOTE — Telephone Encounter (Signed)
Patient called back and is aware that Orthovisc did not get covered by his insurance. He states he has a high deductible and he would have to pay out of pocket for anything he does at this time.   Do I need to see if Synvisc will be covered or have him patient come out of pocket for Orthovisc? He is willing to pay out of pocket for Orthovisc and wants your opinion on what he needs to do. Please advise.

## 2019-01-10 ENCOUNTER — Other Ambulatory Visit: Payer: Self-pay | Admitting: Sports Medicine

## 2019-01-10 DIAGNOSIS — M1711 Unilateral primary osteoarthritis, right knee: Secondary | ICD-10-CM

## 2019-01-15 ENCOUNTER — Encounter: Payer: Self-pay | Admitting: Sports Medicine

## 2019-01-15 ENCOUNTER — Ambulatory Visit (INDEPENDENT_AMBULATORY_CARE_PROVIDER_SITE_OTHER): Payer: No Typology Code available for payment source | Admitting: Sports Medicine

## 2019-01-15 DIAGNOSIS — M11269 Other chondrocalcinosis, unspecified knee: Secondary | ICD-10-CM

## 2019-01-15 MED ORDER — TRAMADOL HCL 50 MG PO TABS
50.0000 mg | ORAL_TABLET | Freq: Three times a day (TID) | ORAL | 0 refills | Status: DC | PRN
Start: 2019-01-15 — End: 2020-01-05

## 2019-01-15 NOTE — Telephone Encounter (Signed)
Patient wants to see about starting the Orthovisc injections and he is ok with paying out of pocket since his insurance will not cover the injections at this time. Patient will discuss with Dr. Darene Lamer at the visit today.

## 2019-01-15 NOTE — Progress Notes (Signed)
  Subjective:    CC: Knee pain  HPI: Daryl Caldwell returns, he has bilateral knee osteoarthritis, he also has complex meniscal tearing in both knees on MRIs.  We have tried injections, NSAIDs, activity modification, he continues to have pain without much in terms of mechanical symptoms.  I reviewed the past medical history, family history, social history, surgical history, and allergies today and no changes were needed.  Please see the problem list section below in epic for further details.  Past Medical History: Past Medical History:  Diagnosis Date  . Hypertension    Past Surgical History: No past surgical history on file. Social History: Social History   Socioeconomic History  . Marital status: Legally Separated    Spouse name: Not on file  . Number of children: Not on file  . Years of education: Not on file  . Highest education level: Not on file  Occupational History  . Not on file  Social Needs  . Financial resource strain: Not on file  . Food insecurity:    Worry: Not on file    Inability: Not on file  . Transportation needs:    Medical: Not on file    Non-medical: Not on file  Tobacco Use  . Smoking status: Current Every Day Smoker    Packs/day: 1.00    Years: 35.00    Pack years: 35.00    Types: Cigarettes  . Smokeless tobacco: Never Used  Substance and Sexual Activity  . Alcohol use: Yes  . Drug use: No  . Sexual activity: Not on file  Lifestyle  . Physical activity:    Days per week: Not on file    Minutes per session: Not on file  . Stress: Not on file  Relationships  . Social connections:    Talks on phone: Not on file    Gets together: Not on file    Attends religious service: Not on file    Active member of club or organization: Not on file    Attends meetings of clubs or organizations: Not on file    Relationship status: Not on file  Other Topics Concern  . Not on file  Social History Narrative  . Not on file   Family History: No family history  on file. Allergies: No Known Allergies Medications: See med rec.  Review of Systems: No fevers, chills, night sweats, weight loss, chest pain, or shortness of breath.   Objective:    General: Well Developed, well nourished, and in no acute distress.  Neuro: Alert and oriented x3, extra-ocular muscles intact, sensation grossly intact.  HEENT: Normocephalic, atraumatic, pupils equal round reactive to light, neck supple, no masses, no lymphadenopathy, thyroid nonpalpable.  Skin: Warm and dry, no rashes. Cardiac: Regular rate and rhythm, no murmurs rubs or gallops, no lower extremity edema.  Respiratory: Clear to auscultation bilaterally. Not using accessory muscles, speaking in full sentences.  Impression and Recommendations:    Pseudogout and primary osteoarthritis of both knees Bilateral OA and meniscal tearing. Failed steroid injections. We discussed the limited success of arthroscopy in cases like these. Referral to Dr. Berenice Primas to discuss arthroplasty  I spent 25 minutes with this patient, greater than 50% was face-to-face time counseling regarding the above diagnoses.  ___________________________________________ Daryl Caldwell, M.D., ABFM., CAQSM. Primary Care and Sports Medicine [facility]  Adjunct Professor of [place] of [facility] of Medicine

## 2019-01-15 NOTE — Assessment & Plan Note (Signed)
Bilateral OA and meniscal tearing. Failed steroid injections. We discussed the limited success of arthroscopy in cases like these. Referral to Dr. Berenice Primas to discuss arthroplasty

## 2019-02-07 ENCOUNTER — Other Ambulatory Visit: Payer: Self-pay | Admitting: Sports Medicine

## 2019-02-07 DIAGNOSIS — M11261 Other chondrocalcinosis, right knee: Secondary | ICD-10-CM

## 2019-05-20 ENCOUNTER — Other Ambulatory Visit: Payer: Self-pay | Admitting: Sports Medicine

## 2019-05-20 DIAGNOSIS — M1711 Unilateral primary osteoarthritis, right knee: Secondary | ICD-10-CM

## 2019-06-26 ENCOUNTER — Other Ambulatory Visit: Payer: Self-pay | Admitting: Sports Medicine

## 2019-06-26 DIAGNOSIS — M1711 Unilateral primary osteoarthritis, right knee: Secondary | ICD-10-CM

## 2019-08-06 ENCOUNTER — Other Ambulatory Visit: Payer: Self-pay | Admitting: Sports Medicine

## 2019-08-06 DIAGNOSIS — M1711 Unilateral primary osteoarthritis, right knee: Secondary | ICD-10-CM

## 2019-08-06 NOTE — Telephone Encounter (Signed)
Needs appointment

## 2019-09-02 ENCOUNTER — Other Ambulatory Visit: Payer: Self-pay | Admitting: Sports Medicine

## 2019-09-02 DIAGNOSIS — M1711 Unilateral primary osteoarthritis, right knee: Secondary | ICD-10-CM

## 2019-10-07 ENCOUNTER — Other Ambulatory Visit: Payer: Self-pay | Admitting: Sports Medicine

## 2019-10-07 DIAGNOSIS — M1711 Unilateral primary osteoarthritis, right knee: Secondary | ICD-10-CM

## 2019-11-03 ENCOUNTER — Other Ambulatory Visit: Payer: Self-pay | Admitting: Sports Medicine

## 2019-11-03 DIAGNOSIS — M1711 Unilateral primary osteoarthritis, right knee: Secondary | ICD-10-CM

## 2019-12-02 ENCOUNTER — Other Ambulatory Visit: Payer: Self-pay | Admitting: Sports Medicine

## 2019-12-02 DIAGNOSIS — M1711 Unilateral primary osteoarthritis, right knee: Secondary | ICD-10-CM

## 2019-12-24 ENCOUNTER — Telehealth: Payer: Self-pay | Admitting: Sports Medicine

## 2019-12-24 ENCOUNTER — Other Ambulatory Visit: Payer: Self-pay

## 2019-12-24 ENCOUNTER — Ambulatory Visit (INDEPENDENT_AMBULATORY_CARE_PROVIDER_SITE_OTHER): Payer: BLUE CROSS/BLUE SHIELD | Admitting: Sports Medicine

## 2019-12-24 ENCOUNTER — Encounter: Payer: Self-pay | Admitting: Sports Medicine

## 2019-12-24 DIAGNOSIS — M170 Bilateral primary osteoarthritis of knee: Secondary | ICD-10-CM

## 2019-12-24 DIAGNOSIS — M11269 Other chondrocalcinosis, unspecified knee: Secondary | ICD-10-CM | POA: Diagnosis not present

## 2019-12-24 DIAGNOSIS — M79671 Pain in right foot: Secondary | ICD-10-CM | POA: Insufficient documentation

## 2019-12-24 NOTE — Assessment & Plan Note (Signed)
Bilateral OA, we did discuss referral for arthroplasty approximately a year ago. Now having recurrence of pain, never went to the surgeon. Bilateral knee injections today, I am also going to get him approved for viscosupplementation, return to start Orthovisc when approved.

## 2019-12-24 NOTE — Assessment & Plan Note (Signed)
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 I would like him to get some custom molded orthotics.

## 2019-12-24 NOTE — Telephone Encounter (Signed)
Bilateral Orthovisc approval please, x-ray confirmed, failed NSAIDs, steroid injections.  Call patient to schedule when approved.

## 2019-12-24 NOTE — Addendum Note (Signed)
Addended by: Silverio Decamp on: 12/24/2019 03:22 PM   Modules accepted: Orders

## 2019-12-24 NOTE — Progress Notes (Addendum)
    Procedures performed today:    Procedure: Real-time Ultrasound Guided injection of the left knee Device: Samsung HS60  Verbal informed consent obtained.  Time-out conducted.  Noted no overlying erythema, induration, or other signs of local infection.  Skin prepped in a sterile fashion.  Local anesthesia: Topical Ethyl chloride.  With sterile technique and under real time ultrasound guidance: 1 cc Kenalog 40, 2 cc lidocaine, 2 cc bupivacaine injected easily Completed without difficulty  Pain immediately resolved suggesting accurate placement of the medication.  Advised to call if fevers/chills, erythema, induration, drainage, or persistent bleeding.  Images permanently stored and available for review in the ultrasound unit.  Impression: Technically successful ultrasound guided injection.   Procedure: Real-time Ultrasound Guided injection of the right knee Device: Samsung HS60  Verbal informed consent obtained.  Time-out conducted.  Noted no overlying erythema, induration, or other signs of local infection.  Skin prepped in a sterile fashion.  Local anesthesia: Topical Ethyl chloride.  With sterile technique and under real time ultrasound guidance: 1 cc Kenalog 40, 2 cc lidocaine, 2 cc bupivacaine injected easily Completed without difficulty  Pain immediately resolved suggesting accurate placement of the medication.  Advised to call if fevers/chills, erythema, induration, drainage, or persistent bleeding.  Images permanently stored and available for review in the ultrasound unit.  Impression: Technically successful ultrasound guided injection.  Independent interpretation of notes and tests performed by another provider:   None.  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 Now having recurrence of pain, never went to the surgeon. Bilateral knee  injections today, I am also going to get him approved for viscosupplementation, return to start Orthovisc when approved.  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 I would like him to get some custom molded orthotics.    ___________________________________________ Gwen Her. Dianah Field, M.D., ABFM., CAQSM. Primary Care and [place] Instructor of [place] of [facility] of Medicine

## 2019-12-29 NOTE — Telephone Encounter (Signed)
Started Orthovisc case [id number] today - CF

## 2020-01-05 ENCOUNTER — Ambulatory Visit (INDEPENDENT_AMBULATORY_CARE_PROVIDER_SITE_OTHER): Payer: BLUE CROSS/BLUE SHIELD | Admitting: Sports Medicine

## 2020-01-05 ENCOUNTER — Other Ambulatory Visit: Payer: Self-pay

## 2020-01-05 DIAGNOSIS — M11261 Other chondrocalcinosis, right knee: Secondary | ICD-10-CM | POA: Diagnosis not present

## 2020-01-05 DIAGNOSIS — M11269 Other chondrocalcinosis, unspecified knee: Secondary | ICD-10-CM

## 2020-01-05 DIAGNOSIS — M1711 Unilateral primary osteoarthritis, right knee: Secondary | ICD-10-CM

## 2020-01-05 MED ORDER — CELECOXIB 200 MG PO CAPS: 200.0000 mg | ORAL_CAPSULE | Freq: Every day | ORAL | 3 refills | Status: DC

## 2020-01-05 MED ORDER — TRAMADOL HCL 50 MG PO TABS: 50.0000 mg | ORAL_TABLET | Freq: Three times a day (TID) | ORAL | 0 refills | Status: DC | PRN

## 2020-01-05 MED ORDER — COLCHICINE 0.6 MG PO TABS: 0.6000 mg | ORAL_TABLET | Freq: Every day | ORAL | 3 refills | Status: DC

## 2020-01-05 NOTE — Progress Notes (Signed)
    Procedures performed today:    None.  Independent interpretation of notes and tests performed by another provider:   None.  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 He has had some steroid injections, the most recent of which was a month ago, he did have return of his pain. We were trying to get him approved for viscosupplementation which sounds to be a plan exclusion and over $5000 out-of-pocket. I added tramadol today, and we gave the option of PRP which would be approximately one half the price. He will look into it, and then in 2 weeks if he would like to do it he needs to be off of Celebrex for 2 weeks, and we would use the leukocyte poor system.    ___________________________________________ Gwen Her. Dianah Field, M.D., ABFM., CAQSM. Primary Care and [place] Instructor of [place] of [facility] of Medicine

## 2020-01-05 NOTE — Assessment & Plan Note (Signed)
Daryl Caldwell returns, he is a pleasant 54 year old male with bilateral knee osteoarthritis, historically taking colchicine and Celebrex. He has had some steroid injections, the most recent of which was a month ago, he did have return of his pain. We were trying to get him approved for viscosupplementation which sounds to be a plan exclusion and over $5000 out-of-pocket. I added tramadol today, and we gave the option of PRP which would be approximately one half the price. He will look into it, and then in 2 weeks if he would like to do it he needs to be off of Celebrex for 2 weeks, and we would use the leukocyte poor system.

## 2020-01-05 NOTE — Patient Instructions (Signed)
 Platelet-rich plasma is used in musculoskeletal medicine to focus your own body's ability to heal. It has several well-done published randomized control trials (RCT) which demonstrate both its effectiveness and safety in many musculoskeletal conditions, including osteoarthritis, tendinopathies, and damaged vertebral discs. PRP has been in clinical use since the 1990's. Many people know that platelets form a clot if there is a cut in the skin. It turns out that platelets do not only form a clot, they also start the body's own repair process. When platelets activate to form a clot, they also release alpha granules which have hundreds of chemical messengers in them that initiate and organize repair to the damaged tissue. Precisely placing PRP into the site of injury will initiate the healing process by activating on the damaged cartilage or tendon. This is an inflammatory process, and inflammation is the vital first phase of Healing.  What to expect and how to prepare for PRP  . 2 weeks prior to the procedure: depending on the procedure, you may need to arrange for a driver to bring you home. IF you are having a lower extremity procedure, we can provide crutches as needed.  . 7 days prior to the procedure: Stop taking anti-inflammatory drugs like ibuprofen, Naprosyn, Celebrex, or Meloxicam. Let your doctor know if you have been taking prednisone or other corticosteroids in the last month.  . The day before the procedure: thoroughly shower and clean your skin.   . The day of the procedure: Wear loose-fitting clothing like sweatpants or shorts. If you are having an upper body procedure wear a top that can button or zip up.  PRP will initiate healing and a productive inflammation, and PRP therapy will make the body part treated sore for 4 days to two weeks. Anti-inflammatory drugs (i.e. ibuprofen, Naprosyn, Celebrex) and corticosteroids such as prednisone can blunt or stop this process, so  it is important to not take any anti-inflammatory drugs for 7 days before getting PRP therapy, or for at least three weeks after PRP therapy. Corticosteroid injections can blunt inflammation for 30 days, so let us know if you have had one recently. Depending on the body part injected, you may be in a sling or on crutches for several days. Just like wringing out a wet dishcloth, if you load or tense a tendon or ligament that has just been injected with PRP, some of the PRP injected will squish out. By keeping the body part treated relaxed by using a sling (for the shoulder or arm) or crutches (for hips and legs) for a few days, the PRP can bind in place and do its job.   You may need a driver to bring you home.  Tobacco?nicotine is a potent toxin and its use constricts small blood vessels which are needed for tissue repair.  Tobacco/nicotine use will limit the effectiveness of any treatment and stopping tobacco use is one of the single  greatest actions you can take to improve your health. Avoid toxins like alcohol, which inhibits and depresses the cells needed for tissue repair.  What happens during the PRP procedure?  Platelet rich plasma is made by taking some of your blood and performing a two-stage centrifuge process on it to concentrate the PRP. First, your blood is drawn into a syringe with a small amount of anti-coagulant in it (this is to keep the blood from clotting during this process). The amount of blood drawn is usually about 30-60 milliliters, depending on how much PRP is needed for   the treatment.  (There are 355 milliliters in a 12-ounce soda can for comparison).  Then the blood is transferred in a sterile fashion into a centrifuge tube. It is then centrifuged for the first cycle where the red blood cells are isolated and discarded. In the second centrifuge cycle, the platelet-rich fraction of the remaining plasma is concentrated and placed in a syringe. The skin at the  injection site is numbed with a small amount of topical cooling spray. Dr Liany Mumpower will then precisely inject the PRP into the injury site using ultrasound guidance.  What to do after your procedure  I will give you specific medicine to control any discomfort you may have after the procedure. Avoid NSAIDs like ibuprofen. Acetaminophen can be used for mild pain.  Depending on the part of the body treated, usually you will be placed in a sling or on crutches for 1 to 3 days. Do your best not to tense or load the treated area during this time. After 3 days, unless otherwise instructed, the treated body part should be used and slowly moved through its full range of motion. It will be sore, but you will not be doing damage by moving it, in fact it needs to move to heal. If you were on crutches for a period of time, walking is ok once you are off the crutches. For now, avoid activities that specifically hurt you before being treated. Exercise is vital to good health and finding a way to cross train around your injury is important not only for your physical health, but for your mental health as well. Ask me about cross training options for your injury. Some brief (10 minutes or less) period of heat or ice therapy will not hurt the therapy, but it is not required. Usually, depending on the initial injury, physical therapy is started from two weeks to four weeks after injection. Improvements in pain and function should be expected from 8 weeks to 12 weeks after injection and some injuries may require more than one treatment.    ___________________________________________ Daryl Caldwell J. Daryl Caldwell, M.D., ABFM., CAQSM. Primary Care and Sports Medicine [facility]  Adjunct Professor of Family Medicine  [employer]   

## 2020-01-08 ENCOUNTER — Ambulatory Visit: Payer: BLUE CROSS/BLUE SHIELD | Admitting: Sports Medicine

## 2020-01-29 NOTE — Telephone Encounter (Signed)
Signed.

## 2020-01-29 NOTE — Telephone Encounter (Signed)
After spending an hour on the phone with Daryl Caldwell and his insurance company the other day they determined the Orthovisc would require a PA they faxed the form I filled it out and placed it in the providers box for a signature.  I will fax once signed. - CF

## 2020-02-10 MED ORDER — SYNVISC 16 MG/2ML IX SOSY: PREFILLED_SYRINGE | INTRA_ARTICULAR | 3 refills | Status: DC

## 2020-02-10 NOTE — Telephone Encounter (Signed)
Received fax from [facility] they denied coverage on Orthovisc they would like patient to try durolane or Gelsyn first then Synvisc before approving Orthovisc placing in providers box for review. - CF  Ref # [id number]

## 2020-02-10 NOTE — Addendum Note (Signed)
Addended by: Silverio Decamp on: 02/10/2020 12:08 PM   Modules accepted: Orders

## 2020-02-10 NOTE — Telephone Encounter (Signed)
Noted Synvisc is the preferred agent, looks like [facility] as the pharmacy preferred?  I will send it in to them.  Please let patient know that this was denied and they have another preferred agent.

## 2020-02-23 ENCOUNTER — Ambulatory Visit (INDEPENDENT_AMBULATORY_CARE_PROVIDER_SITE_OTHER): Payer: BLUE CROSS/BLUE SHIELD | Admitting: Sports Medicine

## 2020-02-23 ENCOUNTER — Encounter: Payer: Self-pay | Admitting: Sports Medicine

## 2020-02-23 ENCOUNTER — Other Ambulatory Visit: Payer: Self-pay

## 2020-02-23 ENCOUNTER — Ambulatory Visit (INDEPENDENT_AMBULATORY_CARE_PROVIDER_SITE_OTHER): Payer: BLUE CROSS/BLUE SHIELD

## 2020-02-23 DIAGNOSIS — M5416 Radiculopathy, lumbar region: Secondary | ICD-10-CM

## 2020-02-23 DIAGNOSIS — M47816 Spondylosis without myelopathy or radiculopathy, lumbar region: Secondary | ICD-10-CM | POA: Insufficient documentation

## 2020-02-23 MED ORDER — PREDNISONE 50 MG PO TABS: ORAL_TABLET | ORAL | 0 refills | Status: DC

## 2020-02-23 NOTE — Assessment & Plan Note (Signed)
Daryl Caldwell is a pleasant 54 year old male, he has had worsening back pain with radiation down the left leg in an L4 versus L5 distribution, no bowel or bladder dysfunction, saddle numbness, constitutional symptoms, he has gone to the chiropractor and had some manipulations without much improvement. Adding 5 days of prednisone, x-rays, home herniated disc rehab exercises, return to see me in 6 weeks, MRI for interventional planning if no better.

## 2020-02-23 NOTE — Progress Notes (Signed)
    Procedures performed today:    None.  Independent interpretation of notes and tests performed by another provider:   None.  Brief History, Exam, Impression, and Recommendations:    Left lumbar radiculitis Daryl Caldwell is a pleasant 54 year old male, he has had worsening back pain with radiation down the left leg in an L4 versus L5 distribution, no bowel or bladder dysfunction, saddle numbness, constitutional symptoms, he has gone to the chiropractor and had some manipulations without much improvement. Adding 5 days of prednisone, x-rays, home herniated disc rehab exercises, return to see me in 6 weeks, MRI for interventional planning if no better.    ___________________________________________ Gwen Her. Dianah Field, M.D., ABFM., CAQSM. Primary Care and [place] Instructor of [place] of [facility] of Medicine

## 2020-03-11 NOTE — Telephone Encounter (Signed)
Received Authorization for Synvisc and called [employer] with information and called daughter to let her know medication was approved.   Reference [id number] Valid: 03/11/20 - 09/07/20 - CF

## 2020-03-11 NOTE — Telephone Encounter (Signed)
Received a fax from [facility] needing PA on Synvisc I sent through cover my meds and now waiting on determination. - CF

## 2020-03-15 ENCOUNTER — Telehealth: Payer: Self-pay | Admitting: Sports Medicine

## 2020-03-15 MED ORDER — HYDROCODONE-ACETAMINOPHEN 5-325 MG PO TABS: 1.0000 | ORAL_TABLET | Freq: Three times a day (TID) | ORAL | 0 refills | Status: DC | PRN

## 2020-03-15 NOTE — Telephone Encounter (Signed)
Adding a short course of hydrocodone until he can get in for the injection at my next available opening

## 2020-03-15 NOTE — Telephone Encounter (Signed)
Dr Nyoka Cowden Daryl Caldwell daughter and I finally got the pharmacy straight and they are delivering his medicine on Wednesday the 16th. Judson Roch states he has been in severe pain and was wanting to know if we can get his scheduled on Thursday for his first injection as of now you have no openings on Thursday and only one acute spot left on Friday. Please advise. - CF

## 2020-03-19 ENCOUNTER — Ambulatory Visit (INDEPENDENT_AMBULATORY_CARE_PROVIDER_SITE_OTHER): Payer: BLUE CROSS/BLUE SHIELD | Admitting: Sports Medicine

## 2020-03-19 ENCOUNTER — Other Ambulatory Visit: Payer: Self-pay

## 2020-03-19 DIAGNOSIS — M11269 Other chondrocalcinosis, unspecified knee: Secondary | ICD-10-CM | POA: Diagnosis not present

## 2020-03-19 NOTE — Assessment & Plan Note (Signed)
Synvisc injection #1 of 3 into both knees, return in 1 week for #2 of 3. Do not bill for Synvisc.

## 2020-03-19 NOTE — Progress Notes (Signed)
° ° °  Procedures performed today:    Procedure: Real-time Ultrasound Guided injection of the left knee Device: Samsung HS60  Verbal informed consent obtained.  Time-out conducted.  Noted no overlying erythema, induration, or other signs of local infection.  Skin prepped in a sterile fashion.  Local anesthesia: Topical Ethyl chloride.  With sterile technique and under real time ultrasound guidance:  Synvisc injected easily through a 22-gauge needle. completed without difficulty  Pain immediately resolved suggesting accurate placement of the medication.  Advised to call if fevers/chills, erythema, induration, drainage, or persistent bleeding.  Images permanently stored and available for review in the ultrasound unit.  Impression: Technically successful ultrasound guided injection.  Procedure: Real-time Ultrasound Guided injection of the right knee Device: Samsung HS60  Verbal informed consent obtained.  Time-out conducted.  Noted no overlying erythema, induration, or other signs of local infection.  Skin prepped in a sterile fashion.  Local anesthesia: Topical Ethyl chloride.  With sterile technique and under real time ultrasound guidance:  Synvisc injected easily through a 22-gauge needle. completed without difficulty  Pain immediately resolved suggesting accurate placement of the medication.  Advised to call if fevers/chills, erythema, induration, drainage, or persistent bleeding.  Images permanently stored and available for review in the ultrasound unit.  Impression: Technically successful ultrasound guided injection.  Independent interpretation of notes and tests performed by another provider:   None.  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 Do not bill for Synvisc.    ___________________________________________ Gwen Her.  Dianah Field, M.D., ABFM., CAQSM. Primary Care and [place] Instructor of [place] of [facility] of Medicine

## 2020-03-25 ENCOUNTER — Ambulatory Visit (INDEPENDENT_AMBULATORY_CARE_PROVIDER_SITE_OTHER): Payer: BLUE CROSS/BLUE SHIELD | Admitting: Sports Medicine

## 2020-03-25 DIAGNOSIS — M11269 Other chondrocalcinosis, unspecified knee: Secondary | ICD-10-CM

## 2020-03-25 NOTE — Progress Notes (Signed)
    Procedures performed today:    Procedure: Real-time Ultrasound Guidedinjection of the left knee Device: Samsung HS60  Verbal informed consent obtained.  Time-out conducted.  Noted no overlying erythema, induration, or other signs of local infection.  Skin prepped in a sterile fashion.  Local anesthesia: Topical Ethyl chloride.  With sterile technique and under real time ultrasound guidance: Synvisc injected easily through a 22-gauge needle. completed without difficulty  Pain immediately resolved suggesting accurate placement of the medication.  Advised to call if fevers/chills, erythema, induration, drainage, or persistent bleeding.  Images permanently stored and available for review in the ultrasound unit.  Impression: Technically successful ultrasound guided injection.  Procedure: Real-time Ultrasound Guidedinjection of the right knee Device: Samsung HS60  Verbal informed consent obtained.  Time-out conducted.  Noted no overlying erythema, induration, or other signs of local infection.  Skin prepped in a sterile fashion.  Local anesthesia: Topical Ethyl chloride.  With sterile technique and under real time ultrasound guidance: Synvisc injected easily through a 22-gauge needle. completed without difficulty  Pain immediately resolved suggesting accurate placement of the medication.  Advised to call if fevers/chills, erythema, induration, drainage, or persistent bleeding.  Images permanently stored and available for review in the ultrasound unit.  Impression: Technically successful ultrasound guided injection.  Independent interpretation of notes and tests performed by another provider:   None.  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 Do not bill for Synvisc.    ___________________________________________ Daryl Caldwell.  Dianah Field, M.D., ABFM., CAQSM. Primary Care and [place] Instructor of [place] of [facility] of Medicine

## 2020-03-25 NOTE — Assessment & Plan Note (Signed)
Synvisc injection #2 of 3 into both knees, return in 1 week for #3 of 3. Do not bill for Synvisc.

## 2020-04-01 ENCOUNTER — Other Ambulatory Visit: Payer: Self-pay

## 2020-04-01 ENCOUNTER — Ambulatory Visit (INDEPENDENT_AMBULATORY_CARE_PROVIDER_SITE_OTHER): Payer: BLUE CROSS/BLUE SHIELD | Admitting: Sports Medicine

## 2020-04-01 DIAGNOSIS — M11269 Other chondrocalcinosis, unspecified knee: Secondary | ICD-10-CM

## 2020-04-01 DIAGNOSIS — Z23 Encounter for immunization: Secondary | ICD-10-CM | POA: Diagnosis not present

## 2020-04-01 DIAGNOSIS — S41112A Laceration without foreign body of left upper arm, initial encounter: Secondary | ICD-10-CM | POA: Insufficient documentation

## 2020-04-01 MED ORDER — DOXYCYCLINE HYCLATE 100 MG PO TABS: 100.0000 mg | ORAL_TABLET | Freq: Two times a day (BID) | ORAL | 0 refills | Status: AC

## 2020-04-01 NOTE — Assessment & Plan Note (Signed)
Yaman also unfortunately had a fall, he has a skin tear, approximately 3 and half centimeters on his left forearm. Dermabond was applied, he also got his tetanus shot today. Adding doxycycline for 7 days. We can look at the laceration again in a couple of weeks.

## 2020-04-01 NOTE — Assessment & Plan Note (Signed)
Already starting to improve, Synvisc 3 of 3 into both knees, return as needed for this.

## 2020-04-01 NOTE — Addendum Note (Signed)
Addended by: Beatris Ship L on: 04/01/2020 10:13 AM   Modules accepted: Orders

## 2020-04-01 NOTE — Progress Notes (Signed)
    Procedures performed today:    Procedure: Real-time Ultrasound Guidedinjection of theleft knee Device: Samsung HS60  Verbal informed consent obtained.  Time-out conducted.  Noted no overlying erythema, induration, or other signs of local infection.  Skin prepped in a sterile fashion.  Local anesthesia: Topical Ethyl chloride.  With sterile technique and under real time ultrasound guidance:Synvisc injected easily through a 22-gauge needle. completed without difficulty  Pain immediately resolved suggesting accurate placement of the medication.  Advised to call if fevers/chills, erythema, induration, drainage, or persistent bleeding.  Images permanently stored and available for review in the ultrasound unit.  Impression: Technically successful ultrasound guided injection.  Procedure: Real-time Ultrasound Guidedinjection of therightknee Device: Samsung HS60  Verbal informed consent obtained.  Time-out conducted.  Noted no overlying erythema, induration, or other signs of local infection.  Skin prepped in a sterile fashion.  Local anesthesia: Topical Ethyl chloride.  With sterile technique and under real time ultrasound guidance:Synvisc injected easily through a 22-gauge needle. completed without difficulty  Pain immediately resolved suggesting accurate placement of the medication.  Advised to call if fevers/chills, erythema, induration, drainage, or persistent bleeding.  Images permanently stored and available for review in the ultrasound unit.  Impression: Technically successful ultrasound guided injection.  Laceration repair: Indication: bleeding Location: Left forearm Size: 3.5 cm Anesthesia: None needed Wound explored, irrigated, FB removed (if present) Dermabond used to approximate edges of the wound Tolerated well Routine postprocedure instructions d/w pt- keep area clean and bandaged, follow up if concerns/spreading erythema/pain.   Follow up for wound check  in 2 weeks.  Independent interpretation of notes and tests performed by another provider:   None.  Brief History, Exam, Impression, and Recommendations:    Laceration of arm, left, initial encounter Niam also unfortunately had a fall, he has a skin tear, approximately 3 and half centimeters on his left forearm. Dermabond was applied, he also got his tetanus shot today. Adding doxycycline for 7 days. We can look at the laceration again in a couple of weeks.  Pseudogout and primary osteoarthritis of both knees Already starting to improve, Synvisc 3 of 3 into both knees, return as needed for this.    ___________________________________________ Gwen Her. Dianah Field, M.D., ABFM., CAQSM. Primary Care and [place] Instructor of [place] of [facility] of Medicine

## 2020-04-06 ENCOUNTER — Ambulatory Visit: Payer: BLUE CROSS/BLUE SHIELD | Admitting: Sports Medicine

## 2020-04-15 ENCOUNTER — Ambulatory Visit: Payer: BLUE CROSS/BLUE SHIELD | Admitting: Sports Medicine

## 2020-07-19 ENCOUNTER — Other Ambulatory Visit: Payer: Self-pay

## 2020-07-19 ENCOUNTER — Emergency Department
Admission: EM | Admit: 2020-07-19 | Discharge: 2020-07-19 | Disposition: A | Discharge status: Home / Self Care | Payer: Self-pay | Source: Home / Self Care

## 2020-07-19 DIAGNOSIS — H6123 Impacted cerumen, bilateral: Secondary | ICD-10-CM

## 2020-07-19 HISTORY — DX: Pure hypercholesterolemia, unspecified: E78.00

## 2020-07-19 MED ORDER — CARBAMIDE PEROXIDE 6.5 % OT SOLN: 5.0000 [drp] | Freq: Two times a day (BID) | OTIC | 0 refills | Status: AC

## 2020-07-19 NOTE — Discharge Instructions (Signed)
°  Please start using the drops as prescribed to help soften the wax in your ears.  You can try over the counter ear flushing kits but do not use q-tips or other instruments in your ear, which could cause the earwax to be more compacted.  Call to schedule a follow up appointment with an ear nose and throat (ENT) specialist for further evaluation and treatment of your symptoms.  ENTs have various instruments to help remove the stuck wax.

## 2020-07-19 NOTE — ED Notes (Addendum)
Both ears flushed numerous times using mixture of hydrogen peroxide/water per order of E. Gerarda Fraction, [place]. No wax removed from either side. Tilden Fossa, PA notified.

## 2020-07-19 NOTE — ED Provider Notes (Signed)
[name]    CSN: [id number] Arrival date & time: 07/19/20  0919      History   Chief Complaint Chief Complaint  Patient presents with  . Cerumen Impaction    "R ear feels clogged"    HPI Daryl Caldwell is a 54 y.o. male.   HPI  Daryl Caldwell is a 54 y.o. male presenting to UC with c/o Right ear fullness, states it feels clogged since yesterday.  Denies pain. Denies cough, but has some nasal congestion "normal" for him. No HA or dizziness. Hx of cerumen impaction in the past.    Past Medical History:  Diagnosis Date  . Hypercholesteremia   . Hypertension     Patient Active Problem List   Diagnosis Date Noted  . Laceration of arm, left, initial encounter 04/01/2020  . Left lumbar radiculitis 02/23/2020  . Dropped arch with navicular prominence, right foot 12/24/2019  . 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 Lateral epicondylitis, left elbow 10/29/2018  . Benign essential hypertension 01/24/2014    No past surgical history on file.     Home Medications    Prior to Admission medications   Medication Sig Start Date End Date Taking? Authorizing Provider  aspirin 81 MG chewable tablet Chew by mouth daily.    [provider]  carbamide peroxide (DEBROX) 6.5 % OTIC solution Place 5 drops into both ears 2 (two) times daily. 07/19/20   Noe Gens, PA-C  celecoxib (CELEBREX) 200 MG capsule Take 1-2 capsules (200-400 mg total) by mouth daily. 01/05/20   Silverio Decamp, MD  colchicine 0.6 MG tablet Take 1 tablet (0.6 mg total) by mouth daily. 01/05/20   Silverio Decamp, MD  HYDROcodone-acetaminophen (NORCO/VICODIN) 5-325 MG tablet Take 1 tablet by mouth every 8 (eight) hours as needed for moderate pain. 03/15/20   Silverio Decamp, MD  Hylan (SYNVISC) 16 MG/2ML SOSY Inject 1 syringe into each knee weekly x3. (injecting both knees, need 6 syringes) 02/10/20   Silverio Decamp, MD  lisinopril  (PRINIVIL,ZESTRIL) 20 MG tablet Take 20 mg by mouth daily.    [provider]  metoprolol succinate (TOPROL-XL) 50 MG 24 hr tablet Take 50 mg by mouth daily. Take with or immediately following a meal.    [provider]  predniSONE (DELTASONE) 50 MG tablet One tab PO daily for 5 days. 02/23/20   Silverio Decamp, MD  traMADol (ULTRAM) 50 MG tablet Take 1 tablet (50 mg total) by mouth every 8 (eight) hours as needed for moderate pain. Maximum 6 tabs per day. 01/05/20   Silverio Decamp, MD    Family History Family History  Problem Relation Age of Onset  . Osteoarthritis Mother   . Parkinson's disease Father   . Dementia Father     Social History Social History   Tobacco Use  . Smoking status: Current Every Day Smoker    Packs/day: 1.00    Years: 35.00    Pack years: 35.00    Types: Cigarettes  . Smokeless tobacco: Never Used  Substance Use Topics  . Alcohol use: Yes    Comment: "1 pint of whiskey per day"  . Drug use: Yes    Types: Marijuana    Comment: "14 grams per week"     Allergies   Penicillins   Review of Systems Review of Systems  Constitutional: Negative for chills and fever.  HENT: Positive for congestion (minimal) and ear pain (fullness, Right  ear). Negative for sore throat, trouble swallowing and voice change.   Respiratory: Negative for cough and shortness of breath.   Cardiovascular: Negative for chest pain and palpitations.  Gastrointestinal: Negative for abdominal pain, diarrhea, nausea and vomiting.  Musculoskeletal: Negative for arthralgias, back pain and myalgias.  Skin: Negative for rash.  Neurological: Negative for dizziness and headaches.  All other systems reviewed and are negative.    Physical Exam Triage Vital Signs ED Triage Vitals  Enc Vitals Group     BP 07/19/20 0940 130/85     Pulse Rate 07/19/20 0940 71     Resp 07/19/20 0940 16     Temp 07/19/20 0940 (!) 96.7 F (35.9 C)     Temp Source 07/19/20 0940  Oral     SpO2 07/19/20 0940 96 %     Weight --      Height --      Head Circumference --      Peak Flow --      Pain Score 07/19/20 0937 0     Pain Loc --      Pain Edu? --      Excl. in [place]? --    No data found.  Updated Vital Signs BP 130/85 (BP Location: Right Arm)   Pulse 71   Temp (!) 96.7 F (35.9 C) (Oral)   Resp 16   SpO2 96%   Visual Acuity Right Eye Distance:   Left Eye Distance:   Bilateral Distance:    Right Eye Near:   Left Eye Near:    Bilateral Near:     Physical Exam Vitals and nursing note reviewed.  Constitutional:      Appearance: Normal appearance. He is well-developed.  HENT:     Head: Normocephalic and atraumatic.     Right Ear: No tenderness. There is impacted cerumen. No mastoid tenderness.     Left Ear: There is impacted cerumen (partial). Tympanic membrane is not erythematous.     Nose: Nose normal.     Right Sinus: No maxillary sinus tenderness or frontal sinus tenderness.     Left Sinus: No maxillary sinus tenderness or frontal sinus tenderness.     Mouth/Throat:     Lips: Pink.     Mouth: Mucous membranes are moist.     Pharynx: Oropharynx is clear. Uvula midline.  Cardiovascular:     Rate and Rhythm: Normal rate and regular rhythm.  Pulmonary:     Effort: Pulmonary effort is normal. No respiratory distress.     Breath sounds: Normal breath sounds. No stridor. No wheezing, rhonchi or rales.  Musculoskeletal:        General: Normal range of motion.     Cervical back: Normal range of motion and neck supple. No tenderness.  Lymphadenopathy:     Cervical: No cervical adenopathy.  Skin:    General: Skin is warm and dry.  Neurological:     Mental Status: He is alert and oriented to person, place, and time.  Psychiatric:        Behavior: Behavior normal.      UC Treatments / Results  Labs (all labs ordered are listed, but only abnormal results are displayed) Labs Reviewed - No data to display  EKG   Radiology No results  found.  Procedures Procedures (including critical care time)  Medications Ordered in UC Medications - No data to display  Initial Impression / Assessment and Plan / UC Course  I have reviewed the triage vital signs and  the nursing notes.  Pertinent labs & imaging results that were available during my care of the patient were reviewed by me and considered in my medical decision making (see chart for details).     Hx and exam c/w bilateral cerumen impaction Unable to remove cerumen using wax softener and irrigation while in UC Rx: debrox F/u ENT AVS given  Final Clinical Impressions(s) / UC Diagnoses   Final diagnoses:  Bilateral impacted cerumen     Discharge Instructions      Please start using the drops as prescribed to help soften the wax in your ears.  You can try over the counter ear flushing kits but do not use q-tips or other instruments in your ear, which could cause the earwax to be more compacted.  Call to schedule a follow up appointment with an ear nose and throat (ENT) specialist for further evaluation and treatment of your symptoms.  ENTs have various instruments to help remove the stuck wax.     ED Prescriptions    Medication Sig Dispense Auth. Provider   carbamide peroxide (DEBROX) 6.5 % OTIC solution Place 5 drops into both ears 2 (two) times daily. 15 mL Noe Gens, PA-C     PDMP not reviewed this encounter.   Noe Gens, [place] 07/19/20 1132

## 2020-07-19 NOTE — ED Triage Notes (Signed)
Pt presents to Urgent Care with c/o R ear feeling clogged since yesterday morning. Denies pain. Reports some nasal congestion, but states this is "normal" for him.

## 2020-11-16 ENCOUNTER — Ambulatory Visit (INDEPENDENT_AMBULATORY_CARE_PROVIDER_SITE_OTHER): Payer: 59 | Admitting: Sports Medicine

## 2020-11-16 ENCOUNTER — Telehealth: Payer: Self-pay | Admitting: Sports Medicine

## 2020-11-16 ENCOUNTER — Other Ambulatory Visit: Payer: Self-pay

## 2020-11-16 DIAGNOSIS — M11269 Other chondrocalcinosis, unspecified knee: Secondary | ICD-10-CM

## 2020-11-16 NOTE — Assessment & Plan Note (Signed)
This is a pleasant 55 year old male, in the summertime of last year we did a series of Synvisc, he did really well and is having recurrence of pain now, bilateral right worse than left, medial joint line. Bilateral unguided steroid injections today, I am also going to go ahead and work on getting him approved for Synvisc again.

## 2020-11-16 NOTE — Progress Notes (Signed)
    Procedures performed today:    Procedure:  Injection of left knee Consent obtained and verified. Time-out conducted. Noted no overlying erythema, induration, or other signs of local infection. Skin prepped in a sterile fashion. Topical analgesic spray: Ethyl chloride. Completed without difficulty. Meds: 1 cc kenalog 40, 2 cc lidocaine, 2 cc bupivacaine injected easily. Advised to call if fevers/chills, erythema, induration, drainage, or persistent bleeding.  Procedure:  Injection of right knee Consent obtained and verified. Time-out conducted. Noted no overlying erythema, induration, or other signs of local infection. Skin prepped in a sterile fashion. Topical analgesic spray: Ethyl chloride. Completed without difficulty. Meds: 1 cc kenalog 40, 2 cc lidocaine, 2 cc bupivacaine injected easily. Advised to call if fevers/chills, erythema, induration, drainage, or persistent bleeding.  Independent interpretation of notes and tests performed by another provider:   None.  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 Bilateral unguided steroid injections today, I am also going to go ahead and work on getting him approved for Synvisc again.     ___________________________________________ Gwen Her. Dianah Field, M.D., ABFM., CAQSM. Primary Care and [place] Instructor of [place] of [facility] of Medicine

## 2020-11-16 NOTE — Telephone Encounter (Signed)
Daryl Caldwell did really well with Synvisc bilateral in the summer of last year, lets get him approved again.

## 2020-11-18 NOTE — Telephone Encounter (Signed)
Patient has new insurance this year so I submitted Orthovisc for approval waiting on BID and to see if PA is required. - CF

## 2020-12-09 NOTE — Telephone Encounter (Signed)
I called to check on PA and had to submit clinicals sent clinicals now waiting on determination. - CF

## 2020-12-16 NOTE — Telephone Encounter (Signed)
I received approval from [facility] I called patient to let him know and had to leave a voicemail I am waiting on a call back - CF

## 2020-12-23 NOTE — Telephone Encounter (Signed)
I spoke with patient went over cost and scheduled him for his appointments. - CF

## 2020-12-24 ENCOUNTER — Ambulatory Visit (INDEPENDENT_AMBULATORY_CARE_PROVIDER_SITE_OTHER): Payer: 59

## 2020-12-24 ENCOUNTER — Ambulatory Visit (INDEPENDENT_AMBULATORY_CARE_PROVIDER_SITE_OTHER): Payer: 59 | Admitting: Sports Medicine

## 2020-12-24 ENCOUNTER — Other Ambulatory Visit: Payer: Self-pay

## 2020-12-24 DIAGNOSIS — M11269 Other chondrocalcinosis, unspecified knee: Secondary | ICD-10-CM

## 2020-12-24 NOTE — Progress Notes (Signed)
° ° °  Procedures performed today:    Procedure: Real-time Ultrasound Guided injection of the left knee Device: Samsung HS60  Verbal informed consent obtained.  Time-out conducted.  Noted no overlying erythema, induration, or other signs of local infection.  Skin prepped in a sterile fashion.  Local anesthesia: Topical Ethyl chloride.  With sterile technique and under real time ultrasound guidance:  Trace effusion noted, 30 mg/2 mL of OrthoVisc (sodium hyaluronate) in a prefilled syringe was injected easily into the knee through a 22-gauge needle.   Completed without difficulty  Advised to call if fevers/chills, erythema, induration, drainage, or persistent bleeding.  Images permanently stored and available for review in PACS.  Impression: Technically successful ultrasound guided injection.  Procedure: Real-time Ultrasound Guided injection of the right knee Device: Samsung HS60  Verbal informed consent obtained.  Time-out conducted.  Noted no overlying erythema, induration, or other signs of local infection.  Skin prepped in a sterile fashion.  Local anesthesia: Topical Ethyl chloride.  With sterile technique and under real time ultrasound guidance:  Trace effusion noted, 30 mg/2 mL of OrthoVisc (sodium hyaluronate) in a prefilled syringe was injected easily into the knee through a 22-gauge needle.   Completed without difficulty  Advised to call if fevers/chills, erythema, induration, drainage, or persistent bleeding.  Images permanently stored and available for review in PACS.  Impression: Technically successful ultrasound guided injection.  Independent interpretation of notes and tests performed by another provider:   None.  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 1 of 4 into both knees, return in 1 week for #2 of 4.    ___________________________________________ Gwen Her. Dianah Field, M.D.,  ABFM., CAQSM. Primary Care and [place] Instructor of [place] of [facility] of Medicine

## 2020-12-24 NOTE — Assessment & Plan Note (Signed)
Orthovisc No. 1 of 4 into both knees, return in 1 week for #2 of 4.

## 2020-12-31 ENCOUNTER — Ambulatory Visit (INDEPENDENT_AMBULATORY_CARE_PROVIDER_SITE_OTHER): Payer: 59

## 2020-12-31 ENCOUNTER — Other Ambulatory Visit: Payer: Self-pay

## 2020-12-31 ENCOUNTER — Ambulatory Visit (INDEPENDENT_AMBULATORY_CARE_PROVIDER_SITE_OTHER): Payer: 59 | Admitting: Sports Medicine

## 2020-12-31 DIAGNOSIS — M11269 Other chondrocalcinosis, unspecified knee: Secondary | ICD-10-CM

## 2020-12-31 NOTE — Progress Notes (Signed)
    Procedures performed today:    Procedure: Real-time Ultrasound Guided injection of the right knee Device: Samsung HS60  Verbal informed consent obtained.  Time-out conducted.  Noted no overlying erythema, induration, or other signs of local infection.  Skin prepped in a sterile fashion.  Local anesthesia: Topical Ethyl chloride.  With sterile technique and under real time ultrasound guidance:  Noted trace effusion, 30 mg/2 mL of OrthoVisc (sodium hyaluronate) in a prefilled syringe was injected easily into the knee through a 22-gauge needle.   Completed without difficulty  Advised to call if fevers/chills, erythema, induration, drainage, or persistent bleeding.  Images permanently stored and available for review in PACS.  Impression: Technically successful ultrasound guided injection.  Procedure: Real-time Ultrasound Guided injection of the left knee Device: Samsung HS60  Verbal informed consent obtained.  Time-out conducted.  Noted no overlying erythema, induration, or other signs of local infection.  Skin prepped in a sterile fashion.  Local anesthesia: Topical Ethyl chloride.  With sterile technique and under real time ultrasound guidance:  Noted trace effusion, 30 mg/2 mL of OrthoVisc (sodium hyaluronate) in a prefilled syringe was injected easily into the knee through a 22-gauge needle.   Completed without difficulty  Advised to call if fevers/chills, erythema, induration, drainage, or persistent bleeding.  Images permanently stored and available for review in PACS.  Impression: Technically successful ultrasound guided injection.  Independent interpretation of notes and tests performed by another provider:   None.  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   ___________________________________________ Gwen Her. Dianah Field, M.D.,  ABFM., CAQSM. Primary Care and [place] Instructor of [place] of [facility] of Medicine

## 2020-12-31 NOTE — Assessment & Plan Note (Signed)
Orthovisc injection #2 of 4 both knees, return in 1 week for #3 of 4.

## 2021-01-07 ENCOUNTER — Ambulatory Visit (INDEPENDENT_AMBULATORY_CARE_PROVIDER_SITE_OTHER): Payer: 59

## 2021-01-07 ENCOUNTER — Other Ambulatory Visit: Payer: Self-pay

## 2021-01-07 ENCOUNTER — Ambulatory Visit (INDEPENDENT_AMBULATORY_CARE_PROVIDER_SITE_OTHER): Payer: 59 | Admitting: Sports Medicine

## 2021-01-07 DIAGNOSIS — M11269 Other chondrocalcinosis, unspecified knee: Secondary | ICD-10-CM

## 2021-01-07 NOTE — Progress Notes (Signed)
    Procedures performed today:    Procedure: Real-time Ultrasound Guidedinjection of the right knee Device: Samsung HS60  Verbal informed consent obtained.  Time-out conducted.  Noted no overlying erythema, induration, or other signs of local infection.  Skin prepped in a sterile fashion.  Local anesthesia: Topical Ethyl chloride.  With sterile technique and under real time ultrasound guidance: Noted trace effusion, 30 mg/2 mL of OrthoVisc (sodium hyaluronate) in a prefilled syringe was injected easily into the knee through a 22-gauge needle.   Completed without difficulty  Advised to call if fevers/chills, erythema, induration, drainage, or persistent bleeding.  Images permanently stored and available for review in PACS.  Impression: Technically successful ultrasound guided injection.  Procedure: Real-time Ultrasound Guidedinjection of the left knee Device: Samsung HS60  Verbal informed consent obtained.  Time-out conducted.  Noted no overlying erythema, induration, or other signs of local infection.  Skin prepped in a sterile fashion.  Local anesthesia: Topical Ethyl chloride.  With sterile technique and under real time ultrasound guidance: Noted trace effusion, 30 mg/2 mL of OrthoVisc (sodium hyaluronate) in a prefilled syringe was injected easily into the knee through a 22-gauge needle.   Completed without difficulty  Advised to call if fevers/chills, erythema, induration, drainage, or persistent bleeding.  Images permanently stored and available for review in PACS.  Impression: Technically successful ultrasound guided injection.  Independent interpretation of notes and tests performed by another provider:   None.  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 3 of 4 both knees, return in 1 week for #4 of 4.    ___________________________________________ Gwen Her. Dianah Field, M.D.,  ABFM., CAQSM. Primary Care and [place] Instructor of [place] of [facility] of Medicine

## 2021-01-07 NOTE — Assessment & Plan Note (Signed)
Orthovisc No. 3 of 4 both knees, return in 1 week for #4 of 4. 

## 2021-01-13 ENCOUNTER — Other Ambulatory Visit: Payer: Self-pay

## 2021-01-13 ENCOUNTER — Ambulatory Visit (INDEPENDENT_AMBULATORY_CARE_PROVIDER_SITE_OTHER): Payer: 59 | Admitting: Sports Medicine

## 2021-01-13 ENCOUNTER — Ambulatory Visit (INDEPENDENT_AMBULATORY_CARE_PROVIDER_SITE_OTHER): Payer: 59

## 2021-01-13 DIAGNOSIS — M11269 Other chondrocalcinosis, unspecified knee: Secondary | ICD-10-CM

## 2021-01-13 NOTE — Assessment & Plan Note (Signed)
Orthovisc injection #4 of 4 both knees, return to see me as needed.

## 2021-01-13 NOTE — Progress Notes (Signed)
    Procedures performed today:    Procedure: Real-time Ultrasound Guidedinjection of theright knee Device: Samsung HS60  Verbal informed consent obtained.  Time-out conducted.  Noted no overlying erythema, induration, or other signs of local infection.  Skin prepped in a sterile fashion.  Local anesthesia: Topical Ethyl chloride.  With sterile technique and under real time ultrasound guidance:Noted trace effusion, 30 mg/2 mL of OrthoVisc (sodium hyaluronate) in a prefilled syringe was injected easily into the knee through a 22-gauge needle.  Completed without difficulty  Advised to call if fevers/chills, erythema, induration, drainage, or persistent bleeding.  Images permanently stored and available for review in PACS.  Impression: Technically successful ultrasound guided injection.  Procedure: Real-time Ultrasound Guidedinjection of theleftknee Device: Samsung HS60  Verbal informed consent obtained.  Time-out conducted.  Noted no overlying erythema, induration, or other signs of local infection.  Skin prepped in a sterile fashion.  Local anesthesia: Topical Ethyl chloride.  With sterile technique and under real time ultrasound guidance:Noted trace effusion, 30 mg/2 mL of OrthoVisc (sodium hyaluronate) in a prefilled syringe was injected easily into the knee through a 22-gauge needle.  Completed without difficulty  Advised to call if fevers/chills, erythema, induration, drainage, or persistent bleeding.  Images permanently stored and available for review in PACS.  Impression: Technically successful ultrasound guided injection.  Independent interpretation of notes and tests performed by another provider:   None.  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    ___________________________________________ Daryl Caldwell. Daryl Caldwell, M.D.,  ABFM., CAQSM. Primary Care and [place] Instructor of [place] of [facility] of Medicine

## 2021-01-19 ENCOUNTER — Other Ambulatory Visit: Payer: Self-pay | Admitting: Sports Medicine

## 2021-01-19 DIAGNOSIS — M1711 Unilateral primary osteoarthritis, right knee: Secondary | ICD-10-CM

## 2021-02-25 ENCOUNTER — Other Ambulatory Visit: Payer: Self-pay | Admitting: Sports Medicine

## 2021-02-25 DIAGNOSIS — M11261 Other chondrocalcinosis, right knee: Secondary | ICD-10-CM

## 2021-03-01 ENCOUNTER — Other Ambulatory Visit: Payer: Self-pay | Admitting: Sports Medicine

## 2021-03-01 DIAGNOSIS — M11261 Other chondrocalcinosis, right knee: Secondary | ICD-10-CM

## 2021-03-01 MED ORDER — COLCHICINE 0.6 MG PO TABS: 0.6000 mg | ORAL_TABLET | Freq: Every day | ORAL | 3 refills | Status: DC

## 2021-04-20 ENCOUNTER — Other Ambulatory Visit: Payer: Self-pay | Admitting: Sports Medicine

## 2021-04-20 DIAGNOSIS — M1711 Unilateral primary osteoarthritis, right knee: Secondary | ICD-10-CM

## 2021-05-24 ENCOUNTER — Ambulatory Visit (INDEPENDENT_AMBULATORY_CARE_PROVIDER_SITE_OTHER): Payer: 59 | Admitting: Sports Medicine

## 2021-05-24 ENCOUNTER — Other Ambulatory Visit: Payer: Self-pay

## 2021-05-24 DIAGNOSIS — M5416 Radiculopathy, lumbar region: Secondary | ICD-10-CM | POA: Diagnosis not present

## 2021-05-24 MED ORDER — PREDNISONE 50 MG PO TABS: ORAL_TABLET | ORAL | 0 refills | Status: DC

## 2021-05-24 NOTE — Assessment & Plan Note (Signed)
Daryl Caldwell is a very pleasant 55 year old male, known history of lumbar DDD, historically we have treated him for a left leg L4 versus L5 distribution radiculitis last year, he responded well to prednisone, home rehab exercises. He is having recurrence of discomfort, no red flag symptoms, adding prednisone, home rehab, return to see me in 4 weeks as needed, MRI for interventional planning if no better.

## 2021-05-24 NOTE — Progress Notes (Signed)
    Procedures performed today:    None.  Independent interpretation of notes and tests performed by another provider:   None.  Brief History, Exam, Impression, and Recommendations:    Left lumbar radiculitis Wirt is a very pleasant 55 year old male, known history of lumbar DDD, historically we have treated him for a left leg L4 versus L5 distribution radiculitis last year, he responded well to prednisone, home rehab exercises. He is having recurrence of discomfort, no red flag symptoms, adding prednisone, home rehab, return to see me in 4 weeks as needed, MRI for interventional planning if no better.  Chronic process with exacerbation and pharmacologic intervention  ___________________________________________ Gwen Her. Dianah Field, M.D., ABFM., CAQSM. Primary Care and [place] Instructor of [place] of [facility] of Medicine

## 2021-06-21 ENCOUNTER — Ambulatory Visit: Payer: 59 | Admitting: Sports Medicine

## 2021-07-13 ENCOUNTER — Other Ambulatory Visit: Payer: Self-pay | Admitting: Sports Medicine

## 2021-07-13 DIAGNOSIS — M1711 Unilateral primary osteoarthritis, right knee: Secondary | ICD-10-CM

## 2021-08-03 ENCOUNTER — Telehealth: Payer: Self-pay | Admitting: Cardiology

## 2021-08-03 NOTE — Telephone Encounter (Signed)
Patient came in office asking if he could have some more Orthovisc injections done. AM

## 2021-08-03 NOTE — Telephone Encounter (Signed)
Patient has been scheduled for 4 part series Orthovisc injections starting Monday, Nov 7th. AM

## 2021-08-03 NOTE — Telephone Encounter (Signed)
We are within the year and its been 6 months since his last series of injections so lets go ahead and get him scheduled.

## 2021-08-08 ENCOUNTER — Ambulatory Visit: Payer: 59 | Admitting: Sports Medicine

## 2021-08-09 ENCOUNTER — Other Ambulatory Visit: Payer: Self-pay

## 2021-08-09 ENCOUNTER — Ambulatory Visit: Payer: 59 | Admitting: Sports Medicine

## 2021-08-09 ENCOUNTER — Ambulatory Visit (INDEPENDENT_AMBULATORY_CARE_PROVIDER_SITE_OTHER): Payer: 59

## 2021-08-09 DIAGNOSIS — M11269 Other chondrocalcinosis, unspecified knee: Secondary | ICD-10-CM

## 2021-08-09 NOTE — Progress Notes (Signed)
    Procedures performed today:    Procedure: Real-time Ultrasound Guided injection of the right knee Device: Samsung HS60  Verbal informed consent obtained.  Time-out conducted.  Noted no overlying erythema, induration, or other signs of local infection.  Skin prepped in a sterile fashion.  Local anesthesia: Topical Ethyl chloride.  With sterile technique and under real time ultrasound guidance: No effusion noted, 30 mg/2 mL of OrthoVisc (sodium hyaluronate) in a prefilled syringe was injected easily into the knee through a 22-gauge needle. Completed without difficulty  Advised to call if fevers/chills, erythema, induration, drainage, or persistent bleeding.  Images permanently stored and available for review in PACS.  Impression: Technically successful ultrasound guided injection.  Procedure: Real-time Ultrasound Guided injection of the left knee Device: Samsung HS60  Verbal informed consent obtained.  Time-out conducted.  Noted no overlying erythema, induration, or other signs of local infection.  Skin prepped in a sterile fashion.  Local anesthesia: Topical Ethyl chloride.  With sterile technique and under real time ultrasound guidance: No effusion noted, 30 mg/2 mL of OrthoVisc (sodium hyaluronate) in a prefilled syringe was injected easily into the knee through a 22-gauge needle. Completed without difficulty  Advised to call if fevers/chills, erythema, induration, drainage, or persistent bleeding.  Images permanently stored and available for review in PACS.  Impression: Technically successful ultrasound guided injection.  Independent interpretation of notes and tests performed by another provider:   None.  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 1 of 4 both knees, return in 1 week for #2 of 4.    ___________________________________________ Daryl Caldwell. Dianah Field, M.D., ABFM.,  CAQSM. Primary Care and [place] Instructor of [place] of [facility] of Medicine

## 2021-08-09 NOTE — Assessment & Plan Note (Signed)
Orthovisc No. 1 of 4 both knees, return in 1 week for #2 of 4.

## 2021-08-15 ENCOUNTER — Other Ambulatory Visit: Payer: Self-pay

## 2021-08-15 ENCOUNTER — Ambulatory Visit (INDEPENDENT_AMBULATORY_CARE_PROVIDER_SITE_OTHER): Payer: 59

## 2021-08-15 ENCOUNTER — Ambulatory Visit (INDEPENDENT_AMBULATORY_CARE_PROVIDER_SITE_OTHER): Payer: 59 | Admitting: Sports Medicine

## 2021-08-15 DIAGNOSIS — M11269 Other chondrocalcinosis, unspecified knee: Secondary | ICD-10-CM

## 2021-08-15 NOTE — Assessment & Plan Note (Signed)
Orthovisc 2 of 4 both knees, return in 1 week for #3 of 4. 

## 2021-08-15 NOTE — Progress Notes (Signed)
    Procedures performed today:    Procedure: Real-time Ultrasound Guided injection of the right knee Device: Samsung HS60  Verbal informed consent obtained.  Time-out conducted.  Noted no overlying erythema, induration, or other signs of local infection.  Skin prepped in a sterile fashion.  Local anesthesia: Topical Ethyl chloride.  With sterile technique and under real time ultrasound guidance: No effusion noted, 30 mg/2 mL of OrthoVisc (sodium hyaluronate) in a prefilled syringe was injected easily into the knee through a 22-gauge needle. Completed without difficulty  Advised to call if fevers/chills, erythema, induration, drainage, or persistent bleeding.  Images permanently stored and available for review in PACS.  Impression: Technically successful ultrasound guided injection.   Procedure: Real-time Ultrasound Guided injection of the left knee Device: Samsung HS60  Verbal informed consent obtained.  Time-out conducted.  Noted no overlying erythema, induration, or other signs of local infection.  Skin prepped in a sterile fashion.  Local anesthesia: Topical Ethyl chloride.  With sterile technique and under real time ultrasound guidance: No effusion noted, 30 mg/2 mL of OrthoVisc (sodium hyaluronate) in a prefilled syringe was injected easily into the knee through a 22-gauge needle. Completed without difficulty  Advised to call if fevers/chills, erythema, induration, drainage, or persistent bleeding.  Images permanently stored and available for review in PACS.  Impression: Technically successful ultrasound guided injection.  Independent interpretation of notes and tests performed by another provider:   None.  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    ___________________________________________ Gwen Her. Dianah Field, M.D., ABFM.,  CAQSM. Primary Care and [place] Instructor of [place] of [facility] of Medicine

## 2021-08-23 ENCOUNTER — Ambulatory Visit (INDEPENDENT_AMBULATORY_CARE_PROVIDER_SITE_OTHER): Payer: 59

## 2021-08-23 ENCOUNTER — Ambulatory Visit (INDEPENDENT_AMBULATORY_CARE_PROVIDER_SITE_OTHER): Payer: 59 | Admitting: Sports Medicine

## 2021-08-23 DIAGNOSIS — M11269 Other chondrocalcinosis, unspecified knee: Secondary | ICD-10-CM

## 2021-08-23 NOTE — Assessment & Plan Note (Signed)
Orthovisc No. 3 of 4 both knees, return in 1 week for #4 of 4. 

## 2021-08-23 NOTE — Progress Notes (Signed)
    Procedures performed today:    Procedure: Real-time Ultrasound Guided injection of the right knee Device: Samsung HS60  Verbal informed consent obtained.  Time-out conducted.  Noted no overlying erythema, induration, or other signs of local infection.  Skin prepped in a sterile fashion.  Local anesthesia: Topical Ethyl chloride.  With sterile technique and under real time ultrasound guidance: No effusion noted, 30 mg/2 mL of OrthoVisc (sodium hyaluronate) in a prefilled syringe was injected easily into the knee through a 22-gauge needle. Completed without difficulty  Advised to call if fevers/chills, erythema, induration, drainage, or persistent bleeding.  Images permanently stored and available for review in PACS.  Impression: Technically successful ultrasound guided injection.   Procedure: Real-time Ultrasound Guided injection of the left knee Device: Samsung HS60  Verbal informed consent obtained.  Time-out conducted.  Noted no overlying erythema, induration, or other signs of local infection.  Skin prepped in a sterile fashion.  Local anesthesia: Topical Ethyl chloride.  With sterile technique and under real time ultrasound guidance: No effusion noted, 30 mg/2 mL of OrthoVisc (sodium hyaluronate) in a prefilled syringe was injected easily into the knee through a 22-gauge needle. Completed without difficulty  Advised to call if fevers/chills, erythema, induration, drainage, or persistent bleeding.  Images permanently stored and available for review in PACS.  Impression: Technically successful ultrasound guided injection.  Independent interpretation of notes and tests performed by another provider:   None.  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 3 of 4 both knees, return in 1 week for #4 of 4.    ___________________________________________ Gwen Her. Dianah Field, M.D., ABFM.,  CAQSM. Primary Care and [place] Instructor of [place] of [facility] of Medicine

## 2021-08-29 ENCOUNTER — Ambulatory Visit: Payer: 59 | Admitting: Sports Medicine

## 2021-08-30 ENCOUNTER — Ambulatory Visit (INDEPENDENT_AMBULATORY_CARE_PROVIDER_SITE_OTHER): Payer: 59 | Admitting: Sports Medicine

## 2021-08-30 ENCOUNTER — Other Ambulatory Visit: Payer: Self-pay

## 2021-08-30 ENCOUNTER — Ambulatory Visit (INDEPENDENT_AMBULATORY_CARE_PROVIDER_SITE_OTHER): Payer: 59

## 2021-08-30 DIAGNOSIS — M11269 Other chondrocalcinosis, unspecified knee: Secondary | ICD-10-CM | POA: Diagnosis not present

## 2021-08-30 DIAGNOSIS — R222 Localized swelling, mass and lump, trunk: Secondary | ICD-10-CM

## 2021-08-30 NOTE — Assessment & Plan Note (Signed)
Orthovisc 4 of 4 both knees, return as needed 

## 2021-08-30 NOTE — Progress Notes (Signed)
    Procedures performed today:    Procedure: Real-time Ultrasound Guided injection of the right knee Device: Samsung HS60  Verbal informed consent obtained.  Time-out conducted.  Noted no overlying erythema, induration, or other signs of local infection.  Skin prepped in a sterile fashion.  Local anesthesia: Topical Ethyl chloride.  With sterile technique and under real time ultrasound guidance: No effusion noted, 30 mg/2 mL of OrthoVisc (sodium hyaluronate) in a prefilled syringe was injected easily into the knee through a 22-gauge needle. Completed without difficulty  Advised to call if fevers/chills, erythema, induration, drainage, or persistent bleeding.  Images permanently stored and available for review in PACS.  Impression: Technically successful ultrasound guided injection.   Procedure: Real-time Ultrasound Guided injection of the left knee Device: Samsung HS60  Verbal informed consent obtained.  Time-out conducted.  Noted no overlying erythema, induration, or other signs of local infection.  Skin prepped in a sterile fashion.  Local anesthesia: Topical Ethyl chloride.  With sterile technique and under real time ultrasound guidance: No effusion noted, 30 mg/2 mL of OrthoVisc (sodium hyaluronate) in a prefilled syringe was injected easily into the knee through a 22-gauge needle. Completed without difficulty  Advised to call if fevers/chills, erythema, induration, drainage, or persistent bleeding.  Images permanently stored and available for review in PACS.  Impression: Technically successful ultrasound guided injection.  Independent interpretation of notes and tests performed by another provider:   None.  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  Mass of subcutaneous tissue of back Daryl Caldwell also showed me what appears to be a subcutaneous sebaceous cyst, he  will come back at my next 30-minute slot for surgical excision.  Did not appear infected/inflamed today.    ___________________________________________ Gwen Her. Dianah Field, M.D., ABFM., CAQSM. Primary Care and [place] Instructor of [place] of [facility] of Medicine

## 2021-08-30 NOTE — Assessment & Plan Note (Addendum)
Daryl Caldwell also showed me what appears to be a subcutaneous sebaceous cyst, he will come back at my next 30-minute slot for surgical excision.  Did not appear infected/inflamed today.

## 2021-09-07 ENCOUNTER — Other Ambulatory Visit: Payer: Self-pay

## 2021-09-07 ENCOUNTER — Ambulatory Visit (INDEPENDENT_AMBULATORY_CARE_PROVIDER_SITE_OTHER): Payer: 59 | Admitting: Sports Medicine

## 2021-09-07 DIAGNOSIS — L84 Corns and callosities: Secondary | ICD-10-CM

## 2021-09-07 DIAGNOSIS — R222 Localized swelling, mass and lump, trunk: Secondary | ICD-10-CM | POA: Diagnosis not present

## 2021-09-07 MED ORDER — DOXYCYCLINE HYCLATE 100 MG PO TABS: 100.0000 mg | ORAL_TABLET | Freq: Two times a day (BID) | ORAL | 0 refills | Status: AC

## 2021-09-07 NOTE — Addendum Note (Signed)
Addended by: Silverio Decamp on: 09/07/2021 10:12 AM   Modules accepted: Orders

## 2021-09-07 NOTE — Progress Notes (Addendum)
    Procedures performed today:    Procedure:  Excision of 3.5 cm midline back subcutaneous mass Risks, benefits, and alternatives explained and consent obtained. Time out conducted. Surface prepped with alcohol. 10cc lidocaine without epinephine infiltrated in a field block. Adequate anesthesia ensured. Area prepped and draped in a sterile fashion. Excision performed with: I made elliptical incision over the mass with a #10 blade, then using predominantly sharp dissection I was able to remove the mass en bloc, there was some spillage of the contents. It did appear to be a sebaceous cyst, after inspection of the wound and complete removal of all of the cyst wall we closed the incision with two 3-0 vertical mattress Ethilon sutures, I then placed 3 simple interrupted Ethilon simple interrupted sutures to close the skin. Hemostasis achieved. Pt stable.  Independent interpretation of notes and tests performed by another provider:   None.  Brief History, Exam, Impression, and Recommendations:    Mass of subcutaneous tissue of back Daryl Caldwell returns, pleasant 55 year old male, we did a surgical excision of a subcutaneous mass, appeared to be a sebaceous cyst, primary closure, return to see me in a week for removal of the vertical mattress sutures and then a week after that for removal of the simple interrupted sutures. Considering contamination of the wound we will add doxycycline as well for 7 days. Patient does not desire any analgesics.  Corn of toe Referral to podiatry.    ___________________________________________ Gwen Her. Dianah Field, M.D., ABFM., CAQSM. Primary Care and [place] Instructor of [place] of [facility] of Medicine

## 2021-09-07 NOTE — Patient Instructions (Signed)
Incision Care, Adult ?An incision is a surgical cut that is made through your skin. Most incisions are closed after a surgical procedure. Your incision may be closed with stitches (sutures), staples, skin glue, or adhesive strips. You may need to return to your health care provider to have sutures or staples removed. This may occur several days or several weeks after your surgery. Until then, the incision needs to be cared for properly to prevent infection. Follow instructions from your health care provider about how to care for your incision. ?Supplies needed: ?Soap, water, and a clean hand towel. ?Wound cleanser. ?A clean bandage (dressing), if needed. ?Cream or ointment, if told by your health care provider. ?Clean gauze. ?How to care for your incision ?Cleaning the incision ?Ask your health care provider how to clean the incision. This may include: ?Wearing medical gloves. ?Using mild soap and water, or wound cleanser. ?Using a clean gauze to pat the incision dry after cleaning it. ?Dressing changes ?Wash your hands with soap and water for at least 20 seconds before and after you change the dressing. If soap and water are not available, use hand sanitizer. ?Do not use disinfectants or antiseptics, such as rubbing alcohol, to clean open wounds unless told by your health care provider. ?Change your dressing as told by your health care provider. ?Leave sutures, staples, skin glue, or adhesive strips in place. These skin closures may need to stay in place for 2 weeks or longer. If adhesive strip edges start to loosen and curl up, you may trim the loose edges. Do not remove adhesive strips completely unless your health care provider tells you to do that. ?Apply cream or ointment. Do this only as told by your health care provider. ?Cover the incision with a clean dressing. Ask your health care provider when you can start to leave the incision uncovered. ?Checking for infection ?Check your incision area every day for  signs of infection. Check for: ?More redness, swelling, or pain. ?More fluid or blood. ?New warmth, hardness, or a rash that develops along the incision. ?Pus or a bad smell. ? ?Follow these instructions at home ?Medicines ?Take over-the-counter and prescription medicines only as told by your health care provider. ?If you were prescribed an antibiotic medicine, cream, or ointment, take or apply it as told by your health care provider. Do not stop using the antibiotic even if your condition improves. ?Eating and drinking ?Eat a diet that includes protein, vitamin A, vitamin C, and other nutrient-rich foods to help the wound heal. ?Foods rich in protein include meat, fish, eggs, dairy, beans, nuts, and protein supplement drinks. ?Foods rich in vitamin A include carrots and dark green, leafy vegetables. ?Foods rich in vitamin C include citrus fruits, tomatoes, broccoli, and peppers. ?Drink enough fluid to keep your urine pale yellow. ?General instructions ? ?Do not take baths, swim, use a hot tub, or do anything that would put the incision underwater until your health care provider approves. Ask your health care provider if you may take showers. You may only be allowed to take sponge baths. ?Limit movement around your incision to promote healing. ?Avoid straining, lifting, or exercising for the first 2 weeks after your procedure, or for as long as told by your health care provider. ?Return to your normal activities as told by your health care provider. Ask your health care provider what activities are safe for you. ?Do not scratch, pick, or scrub the incision. Keep it covered as told by your health care provider. ?  Protect your incision from the sun when you are outside for the first 6 months, or for as long as told by your health care provider. Cover up the scar area or apply sunscreen that has an SPF of at least 30. ?Do not use any products that contain nicotine or tobacco, such as cigarettes, e-cigarettes, and  chewing tobacco. These can delay incision healing. If you need help quitting, ask your health care provider. ?Keep all follow-up visits. This is important. ?Contact a health care provider if: ?You have any of these signs of infection: ?More redness, swelling, or pain around your incision. ?More fluid or blood coming from your incision. ?New warmth or hardness around your incision. ?Pus or a bad smell coming from your incision. ?A rash that develops along the incision. ?You feel nauseous or you vomit. ?You are dizzy. ?Your sutures, staples, skin glue, or adhesive strips come undone. ?Your wound gets bigger. ?You have a fever. ?Get help right away if: ?Your incision bleeds through the dressing and the bleeding does not stop with gentle pressure. ?The edges of your incision open up and separate. ?These symptoms may represent a serious problem that is an emergency. Do not wait to see if the symptoms will go away. Get medical help right away. Call your local emergency services (911 in the [place] Do not drive yourself to the hospital. ?Summary ?Follow instructions from your health care provider about how to care for your incision. ?Wash your hands with soap and water for at least 20 seconds before and after you change the dressing. If soap and water are not available, use hand sanitizer. ?Check your incision area every day for signs of infection. ?Keep all follow-up visits. This is important. ?This information is not intended to replace advice given to you by your health care provider. Make sure you discuss any questions you have with your health care provider. ?Document Revised: 12/20/2020 Document Reviewed: 12/20/2020 ?Elsevier Patient Education ? 2022 Elsevier Inc[employer] ? ?

## 2021-09-07 NOTE — Assessment & Plan Note (Signed)
Daryl Caldwell returns, pleasant 55 year old male, we did a surgical excision of a subcutaneous mass, appeared to be a sebaceous cyst, primary closure, return to see me in a week for removal of the vertical mattress sutures and then a week after that for removal of the simple interrupted sutures. Considering contamination of the wound we will add doxycycline as well for 7 days. Patient does not desire any analgesics.

## 2021-09-07 NOTE — Assessment & Plan Note (Signed)
Referral to podiatry  

## 2021-09-14 ENCOUNTER — Ambulatory Visit (INDEPENDENT_AMBULATORY_CARE_PROVIDER_SITE_OTHER): Payer: 59 | Admitting: Sports Medicine

## 2021-09-14 ENCOUNTER — Other Ambulatory Visit: Payer: Self-pay

## 2021-09-14 DIAGNOSIS — R222 Localized swelling, mass and lump, trunk: Secondary | ICD-10-CM

## 2021-09-14 NOTE — Progress Notes (Signed)
° ° °  Procedures performed today:    None.  Independent interpretation of notes and tests performed by another provider:   None.  Brief History, Exam, Impression, and Recommendations:    Mass of subcutaneous tissue of back 7 days postsurgical excision of a subcutaneous mass/sebaceous cyst, removal of all sutures today, Dermabond applied to reinforce the wound, return as needed.    ___________________________________________ Gwen Her. Dianah Field, M.D., ABFM., CAQSM. Primary Care and [place] Instructor of [place] of [facility] of Medicine

## 2021-09-14 NOTE — Assessment & Plan Note (Signed)
7 days postsurgical excision of a subcutaneous mass/sebaceous cyst, removal of all sutures today, Dermabond applied to reinforce the wound, return as needed.

## 2021-09-16 ENCOUNTER — Ambulatory Visit (INDEPENDENT_AMBULATORY_CARE_PROVIDER_SITE_OTHER): Payer: 59

## 2021-09-16 ENCOUNTER — Ambulatory Visit (INDEPENDENT_AMBULATORY_CARE_PROVIDER_SITE_OTHER): Payer: 59 | Admitting: Podiatry

## 2021-09-16 ENCOUNTER — Other Ambulatory Visit: Payer: Self-pay

## 2021-09-16 ENCOUNTER — Encounter: Payer: Self-pay | Admitting: Podiatry

## 2021-09-16 DIAGNOSIS — M205X2 Other deformities of toe(s) (acquired), left foot: Secondary | ICD-10-CM | POA: Diagnosis not present

## 2021-09-16 DIAGNOSIS — M79675 Pain in left toe(s): Secondary | ICD-10-CM

## 2021-09-16 DIAGNOSIS — L84 Corns and callosities: Secondary | ICD-10-CM | POA: Diagnosis not present

## 2021-09-16 NOTE — Progress Notes (Signed)
°  Subjective:  Patient ID: Daryl Caldwell, male    DOB: 1966/02/15,   MRN: [id number]  Chief Complaint  Patient presents with   Callouses     corn on 5th - left foot     55 y.o. male presents for corn on left toe that has been present for several years . Referred here by Dr. Darene Lamer to evaluate. Has been using alcohol and peroxide on the areas and continue to come back. Relates he has a procedure done on the right that fixed his corn and would like to do the same on the left. Relates he smokes about a ppd.   . Denies any other pedal complaints. Denies n/v/f/c.   Past Medical History:  Diagnosis Date   Hypercholesteremia    Hypertension     Objective:  Physical Exam: Vascular: DP/PT pulses 2/4 bilateral. CFT <3 seconds. Normal hair growth on digits. No edema.  Skin. No lacerations or abrasions bilateral feet. Hyperkeratotic lesion noted to medial left fifth digit with cored center.  Musculoskeletal: MMT 5/5 bilateral lower extremities in DF, PF, Inversion and Eversion. Deceased ROM in DF of ankle joint.  Neurological: Sensation intact to light touch.   Assessment:   1. 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 Corn of toe      Plan:  Patient was evaluated and treated and all questions answered. -Discussed corns and calluses with patient and treatment options.  -Hyperkeratotic tissue was debrided with chisel without incident. As courtesy  -Discussed adductovarus of the fifth digit and bone prominences causing corns. Patient would like to discuss surgery to correct. Discussed derotational arthroplasty and exostectomy of the left fifth digit.  -Discussed smoking cessation with patient and would like for him to cut smoking in half prior to surgery. Expressed understanding. Discussed smoking increases his risks for adverse events postoperatively.  --Informed surgical risk consent was reviewed and read aloud to the patient.  I reviewed the films.  I have discussed my findings  with the patient in great detail.  I have discussed all risks including but not limited to infection, stiffness, scarring, limp, disability, deformity, damage to blood vessels and nerves, numbness, poor healing, need for braces, arthritis, chronic pain, amputation, death.  All benefits and realistic expectations discussed in great detail.  I have made no promises as to the outcome.  I have provided realistic expectations.  I have offered the patient a 2nd opinion, which they have declined and assured me they preferred to proceed despite the risks. -Patient to be scheduled sometime in the new year.   Lorenda Peck, DPM

## 2021-10-02 ENCOUNTER — Other Ambulatory Visit: Payer: Self-pay

## 2021-10-02 ENCOUNTER — Emergency Department
Admission: EM | Admit: 2021-10-02 | Discharge: 2021-10-02 | Disposition: A | Discharge status: Home / Self Care | Payer: 59 | Source: Home / Self Care

## 2021-10-02 DIAGNOSIS — J309 Allergic rhinitis, unspecified: Secondary | ICD-10-CM

## 2021-10-02 DIAGNOSIS — R059 Cough, unspecified: Secondary | ICD-10-CM | POA: Diagnosis not present

## 2021-10-02 LAB — POC SARS CORONAVIRUS 2 AG -  ED: SARS Coronavirus 2 Ag: NEGATIVE

## 2021-10-02 MED ORDER — AZITHROMYCIN 250 MG PO TABS: 250.0000 mg | ORAL_TABLET | Freq: Every day | ORAL | 0 refills | Status: DC

## 2021-10-02 MED ORDER — FEXOFENADINE HCL 180 MG PO TABS: 180.0000 mg | ORAL_TABLET | Freq: Every day | ORAL | 0 refills | Status: AC

## 2021-10-02 NOTE — ED Provider Notes (Signed)
[name]    CSN: [id number] Arrival date & time: 10/02/21  1445      History   Chief Complaint Chief Complaint  Patient presents with   Fatigue    Fatigue, loss of appetite. X4 days    HPI Daryl Caldwell is a 56 y.o. male.   HPI 56 year old male presents with fatigue, cough, and loss of appetite for 4 days.  Patient is not vaccinated for either COVID or influenza.  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 No pertinent surgical history.     Home Medications    Prior to Admission medications   Medication Sig Start Date End Date Taking? Authorizing Provider  aspirin 81 MG chewable tablet Chew by mouth daily.   Yes [provider]  atorvastatin (LIPITOR) 40 MG tablet Take 40 mg by mouth at bedtime. 08/04/21  Yes [provider]  azithromycin (ZITHROMAX) 250 MG tablet Take 1 tablet (250 mg total) by mouth daily. Take first 2 tablets together, then 1 every day until finished. 10/02/21  Yes Eliezer Lofts, FNP  carbamide peroxide (DEBROX) 6.5 % OTIC solution Place 5 drops into both ears 2 (two) times daily. 07/19/20  Yes Phelps, Erin O, PA-C  celecoxib (CELEBREX) 200 MG capsule TAKE 1 TO 2 CAPSULES BY MOUTH ONCE DAILY 07/13/21  Yes Silverio Decamp, MD  colchicine 0.6 MG tablet Take 1 tablet (0.6 mg total) by mouth daily. 03/01/21  Yes Silverio Decamp, MD  fexofenadine [facility] ALLERGY) 180 MG tablet Take 1 tablet (180 mg total) by mouth  daily for 15 days. 10/02/21 10/17/21 Yes Eliezer Lofts, FNP  lisinopril (PRINIVIL,ZESTRIL) 20 MG tablet Take 20 mg by mouth daily.   Yes [provider]  metoprolol succinate (TOPROL-XL) 50 MG 24 hr tablet Take 50 mg by mouth daily. Take with or immediately following a meal.   Yes [provider]  pantoprazole (PROTONIX) 40 MG tablet Take 40 mg by mouth daily. 06/15/21  Yes [provider]  predniSONE (DELTASONE) 50 MG tablet One tab PO daily for 5 days. 05/24/21   Silverio Decamp, MD  traMADol (ULTRAM) 50 MG tablet Take 1 tablet (50 mg total) by mouth every 8 (eight) hours as needed for moderate pain. Maximum 6 tabs per day. 01/05/20   Silverio Decamp, MD    Family History Family History  Problem Relation Age of Onset   Osteoarthritis Mother    Parkinson's disease Father    Dementia Father     Social History Social History   Tobacco Use   Smoking status: Every Day    Packs/day: 1.00    Years: 35.00    Pack years: 35.00    Types: Cigarettes   Smokeless tobacco: Never  Substance Use Topics   Alcohol use: Yes    Comment: "1 pint of whiskey per day"   Drug use: Yes    Types: Marijuana    Comment: "14 grams per week"     Allergies  Penicillins   Review of Systems Review of Systems  Constitutional:  Positive for fatigue.  Respiratory:  Positive for cough.   Gastrointestinal:        Loss of appetite x4 days  All other systems reviewed and are negative.   Physical Exam Triage Vital Signs ED Triage Vitals  Enc Vitals Group     BP 10/02/21 1510 (!) 155/116     Pulse Rate 10/02/21 1510 (!) 104     Resp 10/02/21 1510 18     Temp 10/02/21 1510 98.8 F (37.1 C)     Temp Source 10/02/21 1510 Oral     SpO2 10/02/21 1510 95 %     Weight 10/02/21 1508 230 lb (104.3 kg)     Height 10/02/21 1508 6\' 2"  (1.88 m)     Head Circumference --      Peak Flow --      Pain Score 10/02/21 1507 0     Pain Loc --      Pain Edu? --      Excl. in  [place]? --    No data found.  Updated Vital Signs BP (!) 155/116 (BP Location: Left Arm)    Pulse (!) 104    Temp 98.8 F (37.1 C) (Oral)    Resp 18    Ht 6\' 2"  (1.88 m)    Wt 230 lb (104.3 kg)    SpO2 95%    BMI 29.53 kg/m      Physical Exam Vitals and nursing note reviewed.  Constitutional:      General: He is not in acute distress.    Appearance: Normal appearance. He is normal weight. He is not ill-appearing.  HENT:     Head: Normocephalic and atraumatic.     Right Ear: Tympanic membrane, ear canal and external ear normal.     Left Ear: Tympanic membrane, ear canal and external ear normal.     Mouth/Throat:     Mouth: Mucous membranes are moist.     Pharynx: Oropharynx is clear.     Comments: Moderate to significant amount of clear drainage of posterior oropharynx noted Eyes:     Extraocular Movements: Extraocular movements intact.     Conjunctiva/sclera: Conjunctivae normal.     Pupils: Pupils are equal, round, and reactive to light.  Cardiovascular:     Rate and Rhythm: Normal rate and regular rhythm.     Pulses: Normal pulses.     Heart sounds: Normal heart sounds.  Pulmonary:     Effort: Pulmonary effort is normal.     Breath sounds: Normal breath sounds.  Musculoskeletal:     Cervical back: Normal range of motion and neck supple.  Skin:    General: Skin is warm and dry.  Neurological:     General: No focal deficit present.     Mental Status: He is alert and oriented to person, place, and time.     UC Treatments / Results  Labs (all labs ordered are listed, but only abnormal results are displayed) Labs Reviewed  POC SARS CORONAVIRUS 2 AG -  ED - Normal    EKG   Radiology No results found.  Procedures Procedures (including critical care time)  Medications Ordered in UC Medications - No data to display  Initial Impression / Assessment and Plan / UC Course  I have reviewed the triage vital signs and the nursing notes.  Pertinent labs & imaging  results that were available during my care of the patient were  reviewed by me and considered in my medical decision making (see chart for details).     MDM: 1.  Cough-Rx Zithromax; 2.  Allergic rhinitis-Rx'd Allegra. Advised/inform patient COVID-19 was negative.  Advised patient to take medication as directed with food to completion.  Advised patient to take Allegra with Zithromax for the next 5 days for concurrent postnasal drip/drainage, may use as needed afterwards.  Patient discharged home, hemodynamically stable. Final Clinical Impressions(s) / UC Diagnoses   Final diagnoses:  Cough, unspecified type  Allergic rhinitis, unspecified seasonality, unspecified trigger     Discharge Instructions      Advised/inform patient EXNTZ-00 was negative.  Advised patient to take medication as directed with food to completion.  Advised patient to take Allegra with Zithromax for the next 5 days for concurrent postnasal drip/drainage, may use as needed afterwards.     ED Prescriptions     Medication Sig Dispense Auth. Provider   azithromycin (ZITHROMAX) 250 MG tablet Take 1 tablet (250 mg total) by mouth daily. Take first 2 tablets together, then 1 every day until finished. 6 tablet Eliezer Lofts, FNP   fexofenadine [facility] ALLERGY) 180 MG tablet Take 1 tablet (180 mg total) by mouth daily for 15 days. 15 tablet Eliezer Lofts, FNP      PDMP not reviewed this encounter.   Eliezer Lofts, [place] 10/02/21 [phone]

## 2021-10-02 NOTE — ED Triage Notes (Signed)
Pt states that he has some fatigue, and loss of appetite. X4 days  Pt states that he isn't vaccinated for covid. Pt states that he hasn't had flu vaccine.

## 2021-10-02 NOTE — Discharge Instructions (Addendum)
Advised/inform patient COVID-19 was negative.  Advised patient to take medication as directed with food to completion.  Advised patient to take Allegra with Zithromax for the next 5 days for concurrent postnasal drip/drainage, may use as needed afterwards.

## 2021-10-04 ENCOUNTER — Telehealth: Payer: Self-pay | Admitting: Urology

## 2021-10-04 NOTE — Telephone Encounter (Signed)
DOS - 10/18/2021  HAMMERT[facility] REPAIR 5TH LEFT --- 82641 EXOSTECTOMY 5TH LEFT --- [id number]   [employer] EFFECTIVE DATE - 10/02/21  SPOKE WITH SILVER WITH [employer] AND SHE STATED THAT FOR CPT CODES 40768 AND 08811 NO PRIOR AUTH IS REQUIRED.  REF # [id number]

## 2021-10-13 ENCOUNTER — Other Ambulatory Visit: Payer: Self-pay | Admitting: Sports Medicine

## 2021-10-13 DIAGNOSIS — M1711 Unilateral primary osteoarthritis, right knee: Secondary | ICD-10-CM

## 2021-10-18 ENCOUNTER — Encounter: Payer: Self-pay | Admitting: Podiatry

## 2021-10-18 ENCOUNTER — Other Ambulatory Visit: Payer: Self-pay | Admitting: Podiatry

## 2021-10-18 DIAGNOSIS — M2042 Other hammer toe(s) (acquired), left foot: Secondary | ICD-10-CM | POA: Diagnosis not present

## 2021-10-18 DIAGNOSIS — L84 Corns and callosities: Secondary | ICD-10-CM | POA: Diagnosis not present

## 2021-10-18 DIAGNOSIS — M205X2 Other deformities of toe(s) (acquired), left foot: Secondary | ICD-10-CM | POA: Diagnosis not present

## 2021-10-18 MED ORDER — ONDANSETRON HCL 4 MG PO TABS: 4.0000 mg | ORAL_TABLET | Freq: Three times a day (TID) | ORAL | 0 refills | Status: AC | PRN

## 2021-10-18 MED ORDER — OXYCODONE-ACETAMINOPHEN 10-325 MG PO TABS: 1.0000 | ORAL_TABLET | Freq: Three times a day (TID) | ORAL | 0 refills | Status: AC | PRN

## 2021-10-21 ENCOUNTER — Ambulatory Visit (INDEPENDENT_AMBULATORY_CARE_PROVIDER_SITE_OTHER): Payer: 59 | Admitting: Podiatry

## 2021-10-21 ENCOUNTER — Other Ambulatory Visit: Payer: Self-pay

## 2021-10-21 DIAGNOSIS — Z9889 Other specified postprocedural states: Secondary | ICD-10-CM

## 2021-10-21 DIAGNOSIS — M205X2 Other deformities of toe(s) (acquired), left foot: Secondary | ICD-10-CM

## 2021-10-21 NOTE — Progress Notes (Signed)
Subjective:  Patient ID: Daryl Caldwell, male    DOB: Feb 08, 1966,  MRN: [id number]  Chief Complaint  Patient presents with   Routine Post Op    Pt is here to get bandage changed. No redness, pain, n/v/f/sob/cp    DOS: 10/18/21 Procedure: Left fifth digit de-rotational arthroplasty.   56 y.o. male returns for POV#1. Relates he is doing well with minimal pain. Relates last night the dressing fell off while he was sleeping and here to have it redressed.   Review of Systems: Negative except as noted in the HPI. Denies N/V/F/Ch.  Past Medical History:  Diagnosis Date   Hypercholesteremia    Hypertension     Current Outpatient Medications:    aspirin 81 MG chewable tablet, Chew by mouth daily., Disp: , Rfl:    atorvastatin (LIPITOR) 40 MG tablet, Take 40 mg by mouth at bedtime., Disp: , Rfl:    azithromycin (ZITHROMAX) 250 MG tablet, Take 1 tablet (250 mg total) by mouth daily. Take first 2 tablets together, then 1 every day until finished., Disp: 6 tablet, Rfl: 0   carbamide peroxide (DEBROX) 6.5 % OTIC solution, Place 5 drops into both ears 2 (two) times daily., Disp: 15 mL, Rfl: 0   celecoxib (CELEBREX) 200 MG capsule, TAKE 1 TO 2 CAPSULES BY MOUTH ONCE DAILY, Disp: 180 capsule, Rfl: 0   colchicine 0.6 MG tablet, Take 1 tablet (0.6 mg total) by mouth daily., Disp: 90 tablet, Rfl: 3   fexofenadine (ALLEGRA ALLERGY) 180 MG tablet, Take 1 tablet (180 mg total) by mouth daily for 15 days., Disp: 15 tablet, Rfl: 0   lisinopril (PRINIVIL,ZESTRIL) 20 MG tablet, Take 20 mg by mouth daily., Disp: , Rfl:    metoprolol succinate (TOPROL-XL) 50 MG 24 hr tablet, Take 50 mg by mouth daily. Take with or immediately following a meal., Disp: , Rfl:    ondansetron (ZOFRAN) 4 MG tablet, Take 1 tablet (4 mg total) by mouth every 8 (eight) hours as needed for nausea or vomiting., Disp: 20 tablet, Rfl: 0   oxyCODONE-acetaminophen (PERCOCET) 10-325 MG tablet, Take 1 tablet by mouth every 8 (eight) hours as  needed for up to 5 days for pain., Disp: 15 tablet, Rfl: 0   pantoprazole (PROTONIX) 40 MG tablet, Take 40 mg by mouth daily., Disp: , Rfl:    predniSONE (DELTASONE) 50 MG tablet, One tab PO daily for 5 days., Disp: 5 tablet, Rfl: 0  Social History   Tobacco Use  Smoking Status Every Day   Packs/day: 1.00   Years: 35.00   Pack years: 35.00   Types: Cigarettes  Smokeless Tobacco Never    Allergies  Allergen Reactions   Penicillins Itching   Objective:  There were no vitals filed for this visit. There is no height or weight on file to calculate BMI. Constitutional Well developed. Well nourished.  Vascular Foot warm and well perfused. Capillary refill normal to all digits.   Neurologic Normal speech. Oriented to person, place, and time. Epicritic sensation to light touch grossly present bilaterally.  Dermatologic Skin healing well without signs of infection. Skin edges well coapted without signs of infection.  Orthopedic: Tenderness to palpation noted about the surgical site.   Radiographs: Toe in good alignment.  Assessment:   1. Status post surgery   2. 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  S/p foot surgery left -Progressing as expected post-operatively. -XR: toe  in good alignment  -WB Status: WBAT  in surgical shoe -Sutures: intact. -Medications: n/a -Foot redressed. Follow-up in 2 weeks for suture removal. February 9th for second post-op appointment.   No follow-ups on file.

## 2021-10-26 ENCOUNTER — Telehealth: Payer: Self-pay | Admitting: Podiatry

## 2021-10-26 ENCOUNTER — Encounter: Payer: 59 | Admitting: Podiatry

## 2021-10-26 NOTE — Telephone Encounter (Signed)
Patient called and wanted to know when he can wear his tennis shoes or can he wear them today.  Please advise

## 2021-11-04 ENCOUNTER — Ambulatory Visit (INDEPENDENT_AMBULATORY_CARE_PROVIDER_SITE_OTHER): Payer: 59

## 2021-11-04 ENCOUNTER — Other Ambulatory Visit: Payer: Self-pay

## 2021-11-04 DIAGNOSIS — M205X2 Other deformities of toe(s) (acquired), left foot: Secondary | ICD-10-CM

## 2021-11-04 DIAGNOSIS — Z9889 Other specified postprocedural states: Secondary | ICD-10-CM | POA: Diagnosis not present

## 2021-11-10 ENCOUNTER — Encounter: Payer: Self-pay | Admitting: Podiatry

## 2021-11-10 ENCOUNTER — Ambulatory Visit (INDEPENDENT_AMBULATORY_CARE_PROVIDER_SITE_OTHER): Payer: 59 | Admitting: Podiatry

## 2021-11-10 ENCOUNTER — Other Ambulatory Visit: Payer: Self-pay

## 2021-11-10 DIAGNOSIS — M205X2 Other deformities of toe(s) (acquired), left foot: Secondary | ICD-10-CM

## 2021-11-10 DIAGNOSIS — Z9889 Other specified postprocedural states: Secondary | ICD-10-CM

## 2021-11-10 NOTE — Progress Notes (Signed)
°  Subjective:  Patient ID: Daryl Caldwell, male    DOB: 02/25/1966,  MRN: [id number]  No chief complaint on file.   DOS: 10/18/21 Procedure: Left fifth digit de-rotational arthroplasty.   56 y.o. male returns for POV#2. Relates he is doing well with minimal pain. States he is ready to get back in regular shoe.   Review of Systems: Negative except as noted in the HPI. Denies N/V/F/Ch.  Past Medical History:  Diagnosis Date   Hypercholesteremia    Hypertension     Current Outpatient Medications:    aspirin 81 MG chewable tablet, Chew by mouth daily., Disp: , Rfl:    atorvastatin (LIPITOR) 40 MG tablet, Take 40 mg by mouth at bedtime., Disp: , Rfl:    azithromycin (ZITHROMAX) 250 MG tablet, Take 1 tablet (250 mg total) by mouth daily. Take first 2 tablets together, then 1 every day until finished., Disp: 6 tablet, Rfl: 0   carbamide peroxide (DEBROX) 6.5 % OTIC solution, Place 5 drops into both ears 2 (two) times daily., Disp: 15 mL, Rfl: 0   celecoxib (CELEBREX) 200 MG capsule, TAKE 1 TO 2 CAPSULES BY MOUTH ONCE DAILY, Disp: 180 capsule, Rfl: 0   colchicine 0.6 MG tablet, Take 1 tablet (0.6 mg total) by mouth daily., Disp: 90 tablet, Rfl: 3   fexofenadine (ALLEGRA ALLERGY) 180 MG tablet, Take 1 tablet (180 mg total) by mouth daily for 15 days., Disp: 15 tablet, Rfl: 0   lisinopril (PRINIVIL,ZESTRIL) 20 MG tablet, Take 20 mg by mouth daily., Disp: , Rfl:    metoprolol succinate (TOPROL-XL) 50 MG 24 hr tablet, Take 50 mg by mouth daily. Take with or immediately following a meal., Disp: , Rfl:    ondansetron (ZOFRAN) 4 MG tablet, Take 1 tablet (4 mg total) by mouth every 8 (eight) hours as needed for nausea or vomiting., Disp: 20 tablet, Rfl: 0   pantoprazole (PROTONIX) 40 MG tablet, Take 40 mg by mouth daily., Disp: , Rfl:    predniSONE (DELTASONE) 50 MG tablet, One tab PO daily for 5 days., Disp: 5 tablet, Rfl: 0  Social History   Tobacco Use  Smoking Status Every Day   Packs/day:  1.00   Years: 35.00   Pack years: 35.00   Types: Cigarettes  Smokeless Tobacco Never    Allergies  Allergen Reactions   Penicillins Itching   Objective:  There were no vitals filed for this visit. There is no height or weight on file to calculate BMI. Constitutional Well developed. Well nourished.  Vascular Foot warm and well perfused. Capillary refill normal to all digits.   Neurologic Normal speech. Oriented to person, place, and time. Epicritic sensation to light touch grossly present bilaterally.  Dermatologic Skin healing well without signs of infection. Skin edges well coapted without signs of infection.  Orthopedic: Tenderness to palpation noted about the surgical site.   Radiographs: Toe in good alignment.  Assessment:   1. Status post surgery   2. 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  S/p foot surgery left -Progressing as expected post-operatively. -XR: toe in good alignment  -WB Status: WBAT and will transition into regular shoe.  -Sutures: removed today without incident.  -Medications: n/a -Foot redressed. Follow-up in 3 weeks for evaluation.   No follow-ups on file.

## 2021-11-18 ENCOUNTER — Other Ambulatory Visit: Payer: Self-pay

## 2021-11-18 ENCOUNTER — Ambulatory Visit: Payer: 59 | Admitting: Podiatry

## 2021-11-18 DIAGNOSIS — Z9889 Other specified postprocedural states: Secondary | ICD-10-CM

## 2021-11-18 DIAGNOSIS — L84 Corns and callosities: Secondary | ICD-10-CM

## 2021-11-18 NOTE — Progress Notes (Signed)
Subjective:  Patient ID: Daryl Caldwell, male    DOB: [birth year],  MRN: [id number]  Chief Complaint  Patient presents with   Toe Pain     Pain in between 4th and 5th toe , skin breakdown    DOS: 10/18/21 Procedure: Left fifth digit de-rotational arthroplasty.   56 y.o. male returns for POV#3. Relates he is doing well but returns early for some concern of build up between his fourth and fifth toes and wanted to make sure he wasn't rubbing a wound.    Review of Systems: Negative except as noted in the HPI. Denies N/V/F/Ch.  Past Medical History:  Diagnosis Date   Hypercholesteremia    Hypertension     Current Outpatient Medications:    aspirin 81 MG chewable tablet, Chew by mouth daily., Disp: , Rfl:    atorvastatin (LIPITOR) 40 MG tablet, Take 40 mg by mouth at bedtime., Disp: , Rfl:    azithromycin (ZITHROMAX) 250 MG tablet, Take 1 tablet (250 mg total) by mouth daily. Take first 2 tablets together, then 1 every day until finished., Disp: 6 tablet, Rfl: 0   carbamide peroxide (DEBROX) 6.5 % OTIC solution, Place 5 drops into both ears 2 (two) times daily., Disp: 15 mL, Rfl: 0   celecoxib (CELEBREX) 200 MG capsule, TAKE 1 TO 2 CAPSULES BY MOUTH ONCE DAILY, Disp: 180 capsule, Rfl: 0   colchicine 0.6 MG tablet, Take 1 tablet (0.6 mg total) by mouth daily., Disp: 90 tablet, Rfl: 3   fexofenadine (ALLEGRA ALLERGY) 180 MG tablet, Take 1 tablet (180 mg total) by mouth daily for 15 days., Disp: 15 tablet, Rfl: 0   lisinopril (PRINIVIL,ZESTRIL) 20 MG tablet, Take 20 mg by mouth daily., Disp: , Rfl:    metoprolol succinate (TOPROL-XL) 50 MG 24 hr tablet, Take 50 mg by mouth daily. Take with or immediately following a meal., Disp: , Rfl:    ondansetron (ZOFRAN) 4 MG tablet, Take 1 tablet (4 mg total) by mouth every 8 (eight) hours as needed for nausea or vomiting., Disp: 20 tablet, Rfl: 0   pantoprazole (PROTONIX) 40 MG tablet, Take 40 mg by mouth daily., Disp: , Rfl:    predniSONE  (DELTASONE) 50 MG tablet, One tab PO daily for 5 days., Disp: 5 tablet, Rfl: 0  Social History   Tobacco Use  Smoking Status Every Day   Packs/day: 1.00   Years: 35.00   Pack years: 35.00   Types: Cigarettes  Smokeless Tobacco Never    Allergies  Allergen Reactions   Penicillins Itching   Objective:  There were no vitals filed for this visit. There is no height or weight on file to calculate BMI. Constitutional Well developed. Well nourished.  Vascular Foot warm and well perfused. Capillary refill normal to all digits.   Neurologic Normal speech. Oriented to person, place, and time. Epicritic sensation to light touch grossly present bilaterally.  Dermatologic Skin healing well without signs of infection. Skin edges well coapted without signs of infection. Hyperkeratotic tissue noted to medial fifth digit on left.   Orthopedic: Tenderness to palpation noted about the surgical site.   Radiographs: Toe in good alignment.  Assessment:   1. Status post surgery   2. Corn of toe      Plan:  Patient was evaluated and treated and all questions answered.  S/p foot surgery left -Progressing as expected post-operatively. -XR: toe in good alignment  -WB Status: WBAT in regular shoe  - Hyperkeratotic tissue debrided. Discussed this should  get better with time. Toe is still swollen and can cause more pressure in regular shoes.  Follow-up in 3 weeks for re-evaluation.   No follow-ups on file.

## 2021-12-01 ENCOUNTER — Ambulatory Visit: Payer: 59 | Admitting: Podiatry

## 2021-12-01 ENCOUNTER — Other Ambulatory Visit: Payer: Self-pay

## 2021-12-01 DIAGNOSIS — Z91199 Patient's noncompliance with other medical treatment and regimen due to unspecified reason: Secondary | ICD-10-CM

## 2021-12-01 DIAGNOSIS — M205X2 Other deformities of toe(s) (acquired), left foot: Secondary | ICD-10-CM

## 2021-12-01 DIAGNOSIS — Z9889 Other specified postprocedural states: Secondary | ICD-10-CM

## 2021-12-01 NOTE — Progress Notes (Addendum)
?Subjective:  ?Patient ID: Daryl Caldwell, male    DOB: 1966-02-02,  MRN: [id number] ? ?Chief Complaint  ?Patient presents with  ? Foot Ulcer  ?  Skin breakdown in between 4th and 5th toe patient states it is not painful but can be. Some swelling and redness.   ? ? ?DOS: 10/18/21 ?Procedure: Left fifth digit de-rotational arthroplasty.  ? ?56 y.o. male returns for POV#4. Relates he is doing well  but here early for pain in between the fourth and fifth toe still. No constant but shooting. Has some redness on the pinky toe .    ? ?Review of Systems: Negative except as noted in the HPI. Denies N/V/F/Ch. ? ?Past Medical History:  ?Diagnosis Date  ? Hypercholesteremia   ? Hypertension   ? ? ?Current Outpatient Medications:  ?  aspirin 81 MG chewable tablet, Chew by mouth daily., Disp: , Rfl:  ?  atorvastatin (LIPITOR) 40 MG tablet, Take 40 mg by mouth at bedtime., Disp: , Rfl:  ?  azithromycin (ZITHROMAX) 250 MG tablet, Take 1 tablet (250 mg total) by mouth daily. Take first 2 tablets together, then 1 every day until finished., Disp: 6 tablet, Rfl: 0 ?  carbamide peroxide (DEBROX) 6.5 % OTIC solution, Place 5 drops into both ears 2 (two) times daily., Disp: 15 mL, Rfl: 0 ?  celecoxib (CELEBREX) 200 MG capsule, TAKE 1 TO 2 CAPSULES BY MOUTH ONCE DAILY, Disp: 180 capsule, Rfl: 0 ?  colchicine 0.6 MG tablet, Take 1 tablet (0.6 mg total) by mouth daily., Disp: 90 tablet, Rfl: 3 ?  fexofenadine (ALLEGRA ALLERGY) 180 MG tablet, Take 1 tablet (180 mg total) by mouth daily for 15 days., Disp: 15 tablet, Rfl: 0 ?  lisinopril (PRINIVIL,ZESTRIL) 20 MG tablet, Take 20 mg by mouth daily., Disp: , Rfl:  ?  metoprolol succinate (TOPROL-XL) 50 MG 24 hr tablet, Take 50 mg by mouth daily. Take with or immediately following a meal., Disp: , Rfl:  ?  ondansetron (ZOFRAN) 4 MG tablet, Take 1 tablet (4 mg total) by mouth every 8 (eight) hours as needed for nausea or vomiting., Disp: 20 tablet, Rfl: 0 ?  pantoprazole (PROTONIX) 40 MG tablet,  Take 40 mg by mouth daily., Disp: , Rfl:  ?  predniSONE (DELTASONE) 50 MG tablet, One tab PO daily for 5 days., Disp: 5 tablet, Rfl: 0 ? ?Social History  ? ?Tobacco Use  ?Smoking Status Every Day  ? Packs/day: 1.00  ? Years: 35.00  ? Pack years: 35.00  ? Types: Cigarettes  ?Smokeless Tobacco Never  ? ? ?Allergies  ?Allergen Reactions  ? Penicillins Itching  ? ?Objective:  ?There were no vitals filed for this visit. ?There is no height or weight on file to calculate BMI. ?Constitutional Well developed. ?Well nourished.  ?Vascular Foot warm and well perfused. ?Capillary refill normal to all digits.   ?Neurologic Normal speech. ?Oriented to person, place, and time. ?Epicritic sensation to light touch grossly present bilaterally.  ?Dermatologic Skin healing well without signs of infection. Skin edges well coapted without signs of infection. Hyperkeratotic tissue noted to medial fifth digit on left.   ?Orthopedic: Tenderness to palpation noted about the surgical site.  ? ?Radiographs: Toe in good alignment.  ?Assessment:  ? ?1. Status post surgery   ?2. No-show for appointment   ?3. 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 ? ? ?Plan:  ?Patient was evaluated and treated and all questions answered. ? ?S/p foot surgery left ?-  Progressing as expected post-operatively. ?-XR: toe in good alignment  ?-WB Status: WBAT in regular shoe  ?- Macerated area applied betadine and advised to keep betadine in the area. Marland Kitchen  ?Follow-up in about 3 weeks as needed. Discussed we will give it some more time and if he continues to have problems will consider another procedure to reduce the tenderness in this area.  ? ?No follow-ups on file.  ? ?

## 2022-01-11 ENCOUNTER — Other Ambulatory Visit: Payer: Self-pay | Admitting: Sports Medicine

## 2022-01-11 DIAGNOSIS — M1711 Unilateral primary osteoarthritis, right knee: Secondary | ICD-10-CM

## 2022-03-25 IMAGING — DX DG FOOT COMPLETE 3+V*L*
3 series · 3 of 3 positions shown · non-contrast
Comparison: None.

CLINICAL DATA: Postop

EXAM:
LEFT FOOT - COMPLETE 3+ VIEW

[foot ap wb]
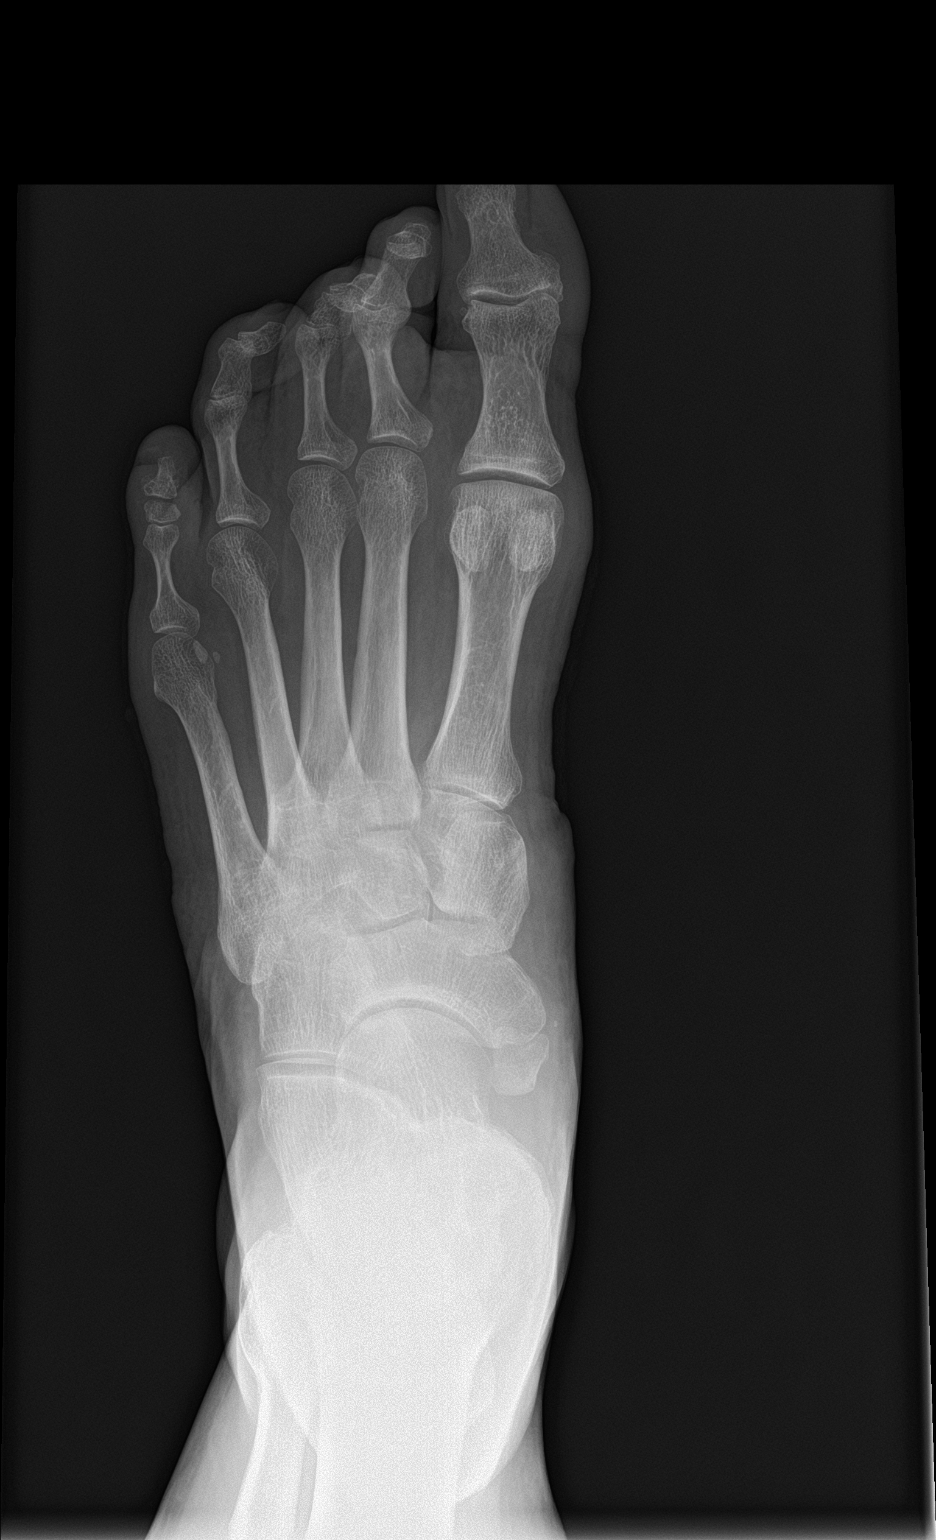

[foot obl wb]
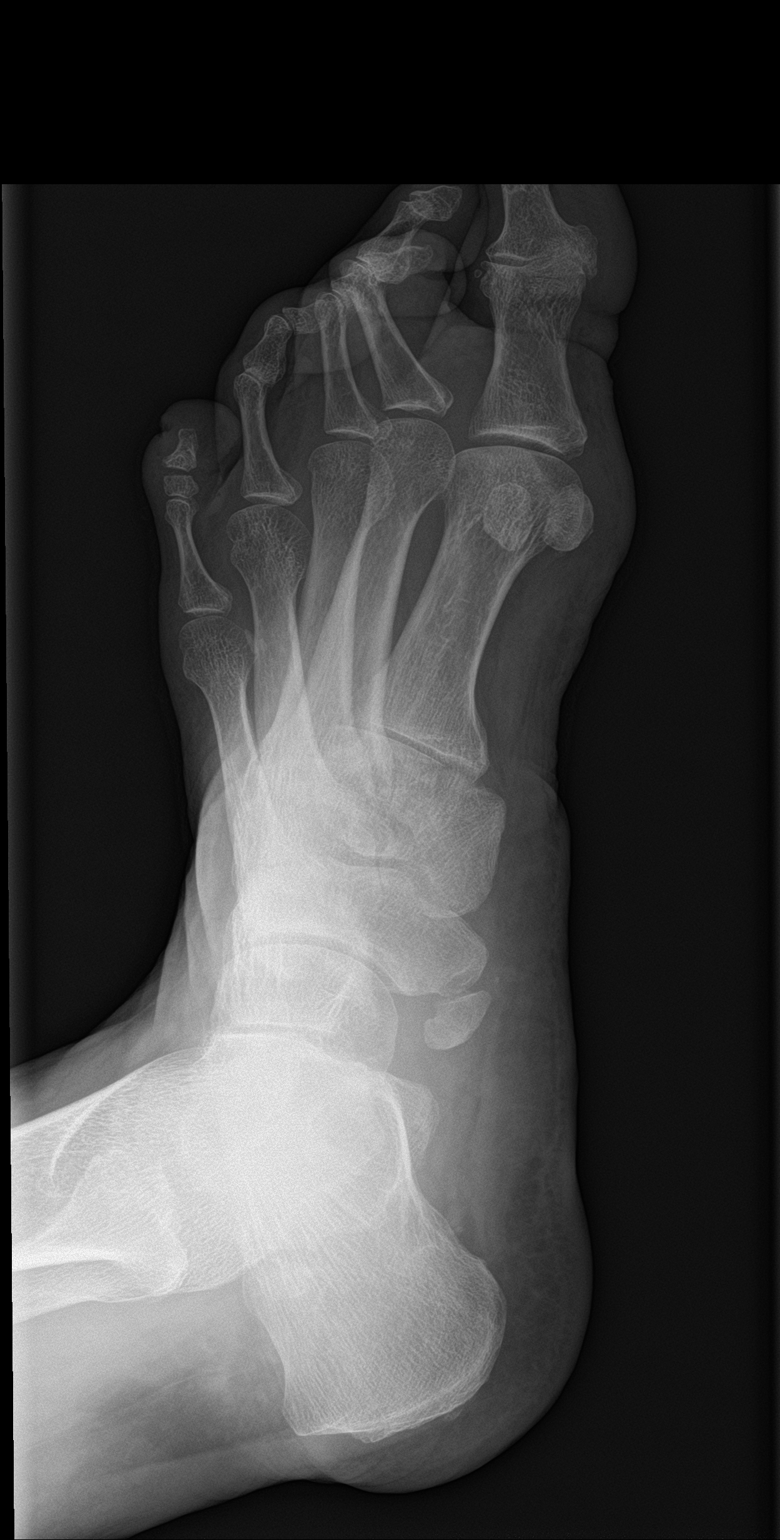

[foot lat wb]
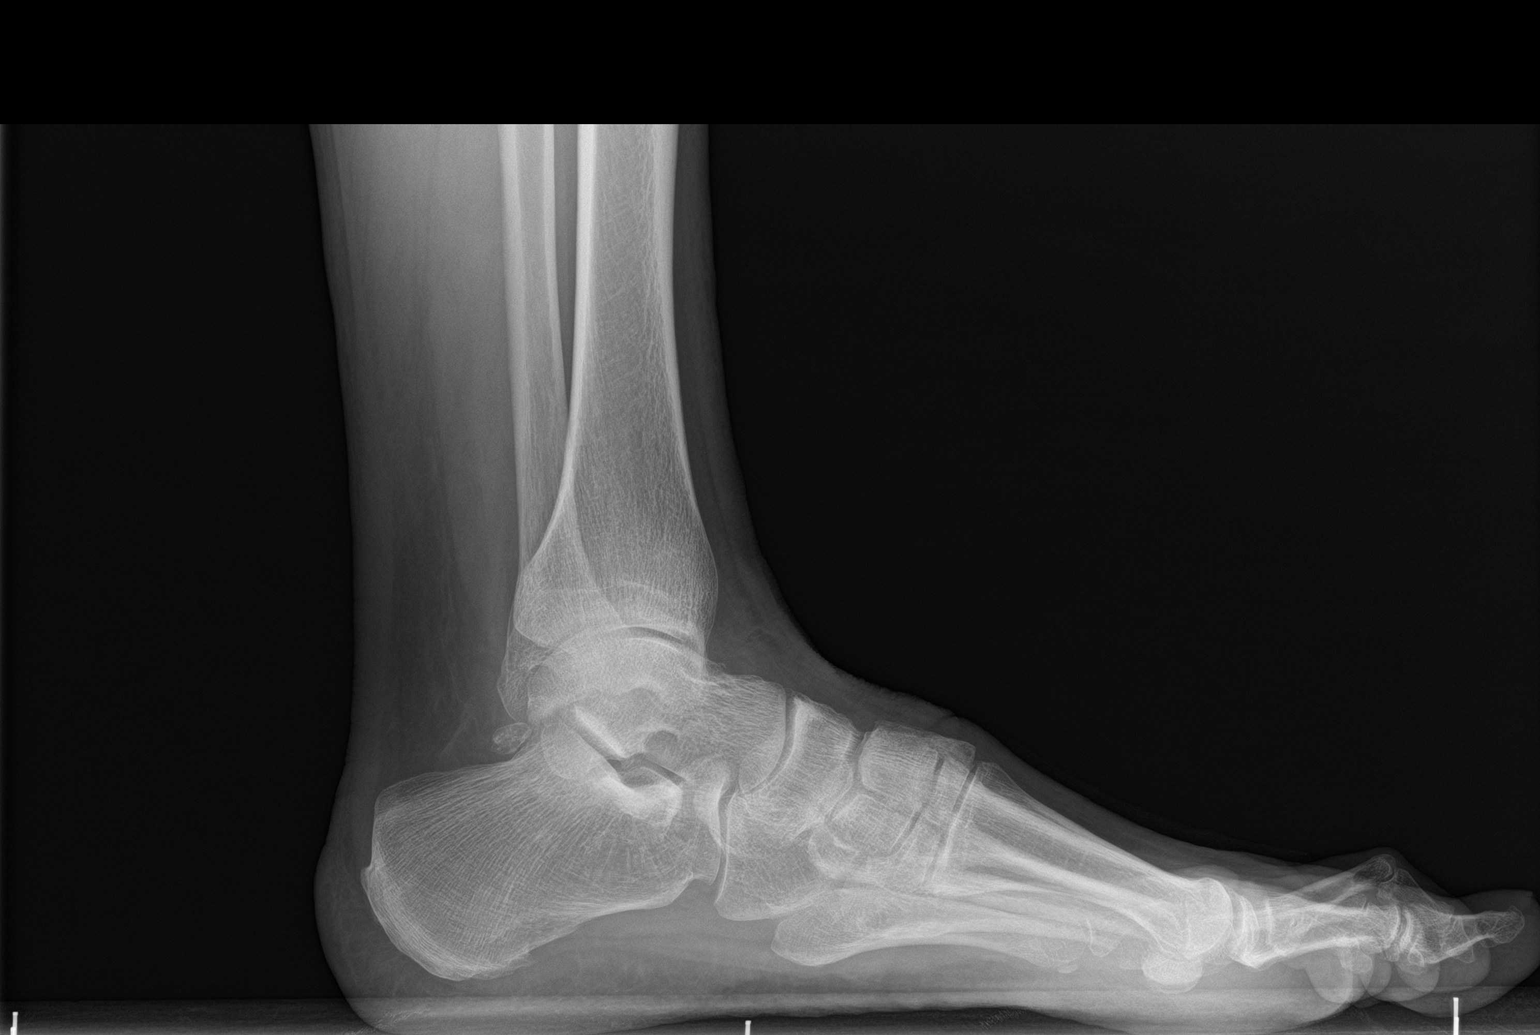

[3 of 3 positions shown; findings below may reference images not displayed]

FINDINGS: Osseous alignment is normal. No fracture line or displaced fracture
fragment seen. No acute appearing cortical irregularity or osseous
lesion. Soft tissues about the LEFT foot are unremarkable.
IMPRESSION: No acute findings.  No evidence of surgical complicating feature.

## 2022-04-07 ENCOUNTER — Other Ambulatory Visit: Payer: Self-pay | Admitting: Sports Medicine

## 2022-04-07 DIAGNOSIS — M1711 Unilateral primary osteoarthritis, right knee: Secondary | ICD-10-CM

## 2022-04-14 ENCOUNTER — Ambulatory Visit (INDEPENDENT_AMBULATORY_CARE_PROVIDER_SITE_OTHER): Payer: Self-pay | Admitting: Podiatry

## 2022-04-14 DIAGNOSIS — Z91199 Patient's noncompliance with other medical treatment and regimen due to unspecified reason: Secondary | ICD-10-CM

## 2022-04-14 NOTE — Progress Notes (Deleted)
No show

## 2022-04-14 NOTE — Addendum Note (Signed)
Addended by: Lorenda Peck R on: 04/14/2022 02:30 PM   Modules accepted: Level of Service

## 2022-04-14 NOTE — Progress Notes (Signed)
No show

## 2022-05-14 ENCOUNTER — Other Ambulatory Visit: Payer: Self-pay | Admitting: Sports Medicine

## 2022-05-14 DIAGNOSIS — M11261 Other chondrocalcinosis, right knee: Secondary | ICD-10-CM

## 2022-07-20 ENCOUNTER — Other Ambulatory Visit: Payer: Self-pay | Admitting: Sports Medicine

## 2022-07-20 DIAGNOSIS — M1711 Unilateral primary osteoarthritis, right knee: Secondary | ICD-10-CM

## 2022-07-28 ENCOUNTER — Encounter: Payer: Self-pay | Admitting: Podiatrist

## 2022-07-28 ENCOUNTER — Ambulatory Visit (INDEPENDENT_AMBULATORY_CARE_PROVIDER_SITE_OTHER): Payer: 59

## 2022-07-28 ENCOUNTER — Ambulatory Visit (INDEPENDENT_AMBULATORY_CARE_PROVIDER_SITE_OTHER): Payer: 59 | Admitting: Podiatrist

## 2022-07-28 DIAGNOSIS — M79671 Pain in right foot: Secondary | ICD-10-CM | POA: Diagnosis not present

## 2022-07-28 DIAGNOSIS — M79672 Pain in left foot: Secondary | ICD-10-CM | POA: Diagnosis not present

## 2022-07-28 DIAGNOSIS — M19072 Primary osteoarthritis, left ankle and foot: Secondary | ICD-10-CM | POA: Diagnosis not present

## 2022-07-28 DIAGNOSIS — M7732 Calcaneal spur, left foot: Secondary | ICD-10-CM | POA: Diagnosis not present

## 2022-07-28 DIAGNOSIS — M2142 Flat foot [pes planus] (acquired), left foot: Secondary | ICD-10-CM | POA: Diagnosis not present

## 2022-07-28 NOTE — Progress Notes (Signed)
Xrays left and right.  Pain fifth toe-   Chief Complaint  Patient presents with   Toe Injury    Pain in the left 5th digit more of a discomfort     HPI: Patient is 56 y.o. male who presents today for pain on the fifth toe of the left foot.  He relates he had surgery by Dr. Blenda Mounts in October 18, 2021  and the toe is the same as prior to the surgery.  He has pain at the interspace from the callus that develops in the webspace.  He also states he had the same problem on the right fifth toe and did well after a surgery that was performed on this toe in the past.     Allergies  Allergen Reactions   Penicillins Itching    Review of systems is negative except as noted in the HPI.  Denies nausea/ vomiting/ fevers/ chills or night sweats.   Denies difficulty breathing, denies calf pain or tenderness  Physical Exam  Patient is awake, alert, and oriented x 3.  In no acute distress.    Vascular status is intact with palpable pedal pulses DP and PT bilateral and capillary refill time less than 3 seconds bilateral.  No edema or erythema noted.   Neurological exam reveals epicritic and protective sensation grossly intact bilateral.   Dermatological exam reveals skin is supple and dry to bilateral feet.  Callus fourth interspace noted on the left foot. No callus on the right.   Musculoskeletal exam: left fifth digit is in rectus alignment-  the toe is a normal length.     Xrays reviewed from today and it appears a exostectomy was performed.  Comparison views on the right foot are taken which show an arthroplasty of the head of the proximal phalanx   Assessment: Residual pain post fifth toe surgery left foot.   Plan: Discussed exam and xray findings with Ronalee Belts.  Discussed that he might benefit from removing more bone from the fifth toe of the left foot.  I will have him see Dr. Paulla Dolly in [place] to see if he agrees or recommends an alternative option.  Today I pared the callus tissue from between  the toes as a courtesy.  He will follow up with Dr. Paulla Dolly for further treatment recommendations.

## 2022-08-14 DIAGNOSIS — I10 Essential (primary) hypertension: Secondary | ICD-10-CM | POA: Diagnosis not present

## 2022-08-14 DIAGNOSIS — E785 Hyperlipidemia, unspecified: Secondary | ICD-10-CM | POA: Diagnosis not present

## 2022-08-16 ENCOUNTER — Encounter: Payer: Self-pay | Admitting: Podiatry

## 2022-08-16 ENCOUNTER — Ambulatory Visit: Payer: 59 | Admitting: Podiatry

## 2022-08-16 DIAGNOSIS — M2042 Other hammer toe(s) (acquired), left foot: Secondary | ICD-10-CM | POA: Diagnosis not present

## 2022-08-16 DIAGNOSIS — L989 Disorder of the skin and subcutaneous tissue, unspecified: Secondary | ICD-10-CM

## 2022-08-16 NOTE — Progress Notes (Signed)
Subjective:   Patient ID: Daryl Caldwell, male   DOB: 56 y.o.   MRN: [id number]   HPI Patient presents with a chronic lesion between the fourth and fifth toe of the left foot that is been very sore.  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  I do think partial syndactylization with proximal arthroplasty will be necessary for this and I educated him on this and he wants it done.  At this point I allowed him to read consent form going over at great length the surgery risk associated with it and procedures chosen the fact we will have to be careful with this for at least a month after the procedure.  Patient wants surgery understands risk and after review signed consent form scheduled outpatient surgery all questions answered today

## 2022-08-17 ENCOUNTER — Telehealth: Payer: Self-pay | Admitting: Podiatry

## 2022-08-17 NOTE — Telephone Encounter (Signed)
DOS: 08/29/2022  [employer] Effective 10/02/2021  Hammertoe Repair 5th Lt [phone]) Webbing Procedure 4th Lt (11031)  DX: M20.42 and D49.2  Deductible: $1,500 with $0 remaining Out-of-Pocket: $7,000 with $4,558.68 remaining CoInsurance: 0%  Prior authorization is not required per [employer].  Reference Number: [id number]

## 2022-08-28 MED ORDER — HYDROCODONE-ACETAMINOPHEN 10-325 MG PO TABS: 1.0000 | ORAL_TABLET | Freq: Three times a day (TID) | ORAL | 0 refills | Status: AC | PRN

## 2022-08-28 NOTE — Addendum Note (Signed)
Addended by: Wallene Huh on: 08/28/2022 05:16 PM   Modules accepted: Orders

## 2022-08-29 ENCOUNTER — Encounter: Payer: Self-pay | Admitting: Podiatry

## 2022-08-29 DIAGNOSIS — D492 Neoplasm of unspecified behavior of bone, soft tissue, and skin: Secondary | ICD-10-CM | POA: Diagnosis not present

## 2022-08-29 DIAGNOSIS — D2372 Other benign neoplasm of skin of left lower limb, including hip: Secondary | ICD-10-CM | POA: Diagnosis not present

## 2022-08-29 DIAGNOSIS — M2042 Other hammer toe(s) (acquired), left foot: Secondary | ICD-10-CM | POA: Diagnosis not present

## 2022-09-01 ENCOUNTER — Other Ambulatory Visit: Payer: Self-pay | Admitting: Sports Medicine

## 2022-09-01 DIAGNOSIS — M11261 Other chondrocalcinosis, right knee: Secondary | ICD-10-CM

## 2022-09-04 ENCOUNTER — Ambulatory Visit (INDEPENDENT_AMBULATORY_CARE_PROVIDER_SITE_OTHER): Payer: 59 | Admitting: Podiatry

## 2022-09-04 ENCOUNTER — Ambulatory Visit (INDEPENDENT_AMBULATORY_CARE_PROVIDER_SITE_OTHER): Payer: 59

## 2022-09-04 VITALS — BP 138/86 | HR 70

## 2022-09-04 DIAGNOSIS — M2042 Other hammer toe(s) (acquired), left foot: Secondary | ICD-10-CM | POA: Diagnosis not present

## 2022-09-04 NOTE — Progress Notes (Signed)
Patient presents today for post op visit # 1, patient of Dr. Paulla Dolly.   POV #1 DOS 08/29/2022 HAMMERTOE REPAIR 5TH LT, WEBBING PROCEDURE 4TH LT      He presents in his darco shoe. Denies any falls or injury to the foot. Foot is swollen. No signs of infection. No calf pain or shortness of breath. Bandages dry and intact. Incision is intact.    BP: 138/86 P: 70    Xrays taken today and reviewed by Dr. Paulla Dolly. He did take a look at her foot today as well.   Foot redressed today and placed him back in the darco shoe. Reviewed icing and elevation. He will follow up with Dr. Paulla Dolly in 2 weeks for POV# 2 and suture removal.

## 2022-09-07 ENCOUNTER — Other Ambulatory Visit: Payer: Self-pay | Admitting: Sports Medicine

## 2022-09-07 DIAGNOSIS — M11261 Other chondrocalcinosis, right knee: Secondary | ICD-10-CM

## 2022-09-15 ENCOUNTER — Ambulatory Visit: Payer: 59 | Admitting: Sports Medicine

## 2022-09-18 ENCOUNTER — Other Ambulatory Visit: Payer: 59

## 2022-09-19 ENCOUNTER — Ambulatory Visit (INDEPENDENT_AMBULATORY_CARE_PROVIDER_SITE_OTHER): Payer: 59

## 2022-09-19 DIAGNOSIS — Z9889 Other specified postprocedural states: Secondary | ICD-10-CM | POA: Insufficient documentation

## 2022-09-19 NOTE — Progress Notes (Signed)
Patient presents today for post op visit # 2, patient of Dr. Paulla Dolly.   POV #2 DOS 08/29/2022 HAMMERTOE REPAIR 5TH LT, WEBBING PROCEDURE 4TH LT      He presents in his regular athletic shoe. Denies any falls or injury to the foot. No signs of infection. No calf pain or shortness of breath.  Sutures intact.    BP: 138/86 P: 70    Sutures removal today pt was reviewed by Dr. Sherryle Lis before removal. He did take a look at her foot today as well and stated sutures are ready for removal and to apply iodine and tape 4th and 5th toe together with steri strips and to see Dr. Paulla Dolly in 1 week.   He will follow up with Dr. Paulla Dolly in 1 weeks for POV# 3

## 2022-09-27 ENCOUNTER — Ambulatory Visit (INDEPENDENT_AMBULATORY_CARE_PROVIDER_SITE_OTHER): Payer: 59 | Admitting: Sports Medicine

## 2022-09-27 ENCOUNTER — Telehealth: Payer: Self-pay | Admitting: Sports Medicine

## 2022-09-27 DIAGNOSIS — M11269 Other chondrocalcinosis, unspecified knee: Secondary | ICD-10-CM | POA: Diagnosis not present

## 2022-09-27 DIAGNOSIS — M1711 Unilateral primary osteoarthritis, right knee: Secondary | ICD-10-CM

## 2022-09-27 MED ORDER — CELECOXIB 200 MG PO CAPS: 200.0000 mg | ORAL_CAPSULE | Freq: Two times a day (BID) | ORAL | 3 refills | Status: DC

## 2022-09-27 NOTE — Telephone Encounter (Signed)
Orthovisc approval please, x-ray confirmed osteoarthritis, has done well with Orthovisc in the past, has failed greater than 6 weeks of conservative treatment.  Bilateral knees.

## 2022-09-27 NOTE — Progress Notes (Signed)
    Procedures performed today:    None.  Independent interpretation of notes and tests performed by another provider:   None.  Brief History, Exam, Impression, and Recommendations:    Pseudogout and primary osteoarthritis of both knees Pleasant 56 year old male, known knee osteoarthritis, we finished a series of Orthovisc over a year ago, he did really well until recently, he would like to do this again so we will work on getting him approved again, refilling Celebrex, he also does some home remedy technique rubbing avocado oil from the inside of an avocado seed on his knees which seem to help. I will see him back once Orthovisc is approved and we can start the injections again.  Chronic process not at goal with pharmacologic intervention  ____________________________________________ Gwen Her. Dianah Field, M.D., ABFM., CAQSM., AME. Primary Care and Sports Medicine [facility]  Adjunct Professor of [place] [employer] [facility] of Medicine  Risk manager

## 2022-09-27 NOTE — Assessment & Plan Note (Signed)
Pleasant 56 year old male, known knee osteoarthritis, we finished a series of Orthovisc over a year ago, he did really well until recently, he would like to do this again so we will work on getting him approved again, refilling Celebrex, he also does some home remedy technique rubbing avocado oil from the inside of an avocado seed on his knees which seem to help. I will see him back once Orthovisc is approved and we can start the injections again.

## 2022-09-28 ENCOUNTER — Encounter: Payer: Self-pay | Admitting: Podiatry

## 2022-09-28 ENCOUNTER — Ambulatory Visit (INDEPENDENT_AMBULATORY_CARE_PROVIDER_SITE_OTHER): Payer: 59

## 2022-09-28 ENCOUNTER — Ambulatory Visit (INDEPENDENT_AMBULATORY_CARE_PROVIDER_SITE_OTHER): Payer: 59 | Admitting: Podiatry

## 2022-09-28 DIAGNOSIS — Z9889 Other specified postprocedural states: Secondary | ICD-10-CM | POA: Diagnosis not present

## 2022-09-28 NOTE — Progress Notes (Signed)
Subjective:   Patient ID: Daryl Caldwell, male   DOB: 56 y.o.   MRN: [id number]   HPI Patient states doing fine minimal discomfort and very pleased   ROS      Objective:  Physical Exam  Neurovascular status intact fourth interspace left healing well wound edges well coapted minimal swelling wearing a shoe at this time with minimal discomfort     Assessment:  Doing well post arthroplasty partial syndactylization fourth interspace left     Plan:  Final x-ray reviewed continue putting Betadine on it return to normal activities if any redness drainage or any other issues were to occur let us know we will probably take another 8 weeks to heal completely  X-rays indicate that there is been satisfactory resection of bone good alignment noted

## 2022-09-28 NOTE — Telephone Encounter (Signed)
PA information submitted via [email or web address] for Orthovisc Paperwork has been printed and given to Dr. T for signatures. Once obtained, information will be faxed to [employer] at [phone]  

## 2022-09-29 ENCOUNTER — Encounter: Payer: Self-pay | Admitting: Family Medicine

## 2022-09-29 ENCOUNTER — Ambulatory Visit (INDEPENDENT_AMBULATORY_CARE_PROVIDER_SITE_OTHER): Payer: 59 | Admitting: Family Medicine

## 2022-09-29 ENCOUNTER — Other Ambulatory Visit: Payer: Self-pay | Admitting: Sports Medicine

## 2022-09-29 ENCOUNTER — Ambulatory Visit (INDEPENDENT_AMBULATORY_CARE_PROVIDER_SITE_OTHER): Payer: 59

## 2022-09-29 ENCOUNTER — Other Ambulatory Visit: Payer: Self-pay | Admitting: Family Medicine

## 2022-09-29 VITALS — BP 151/96 | HR 66 | Temp 97.6°F | Ht 74.0 in | Wt 263.2 lb

## 2022-09-29 DIAGNOSIS — M545 Low back pain, unspecified: Secondary | ICD-10-CM | POA: Diagnosis not present

## 2022-09-29 DIAGNOSIS — M5416 Radiculopathy, lumbar region: Secondary | ICD-10-CM

## 2022-09-29 DIAGNOSIS — M79605 Pain in left leg: Secondary | ICD-10-CM | POA: Diagnosis not present

## 2022-09-29 DIAGNOSIS — M11261 Other chondrocalcinosis, right knee: Secondary | ICD-10-CM

## 2022-09-29 MED ORDER — METHYLPREDNISOLONE ACETATE 80 MG/ML IJ SUSP
80.0000 mg | Freq: Once | INTRAMUSCULAR | Status: AC
  Administered 2022-09-29: 80 mg via INTRAMUSCULAR

## 2022-09-29 MED ORDER — BACLOFEN 10 MG PO TABS: 10.0000 mg | ORAL_TABLET | Freq: Three times a day (TID) | ORAL | 0 refills | Status: AC

## 2022-09-29 MED ORDER — LIDOCAINE 5 % EX PTCH: 1.0000 | MEDICATED_PATCH | Freq: Two times a day (BID) | CUTANEOUS | 0 refills | Status: AC

## 2022-09-29 NOTE — Progress Notes (Signed)
Acute Office Visit  Subjective:     Patient ID: Daryl Caldwell, male    DOB: [birth year], 56 y.o.   MRN: [id number]  Chief Complaint  Patient presents with   Acute Visit    Patient in office  - c/o low back pain  and Left leg burning x this episode x yesterday  after lunch- also episode in May 2023- herniated disc dx     HPI Patient is in today for acute visit. Pt reports he went to the podiatrist for follow up yesterday. 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 He reported no injury or trauma. He began to have back pain and difficulty walking. He reports his pain has progressively worsened. Co-worker gave him a TENs unit that he's been wearing today, which has helped some. He has had imaging of his back and reviewed today. 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 No recent imaging for review. He says he sometimes will have left leg symptoms that feels like his leg is on fire and burning. This occurs from his lateral left hip to his left lateral knee. Denies weakness in leg, bowel or bladder incontinence, or saddle paresthesias.   Patient Active Problem List   Diagnosis Date Noted   Status post surgery 09/19/2022   Corn of toe 09/07/2021   Mass of subcutaneous tissue of back 08/30/2021   Laceration of arm, left, initial encounter 04/01/2020   Left lumbar radiculitis 02/23/2020   Dropped arch with navicular prominence, right foot 12/24/2019   Pseudogout and primary osteoarthritis of both knees 10/29/2018   Lateral epicondylitis, left elbow 10/29/2018   Benign essential hypertension 01/24/2014    Review of Systems  Constitutional:  Negative for chills and fever.  HENT:  Negative for congestion and ear pain.   Respiratory:  Negative for cough.   Musculoskeletal:  Positive for back pain and joint pain. Negative for myalgias.  Neurological:  Positive for tingling and sensory change. Negative for focal weakness and weakness.  All other systems  reviewed and are negative.       Objective:    BP (!) 151/96   Pulse 66   Temp 97.6 F (36.4 C)   Ht '6\' 2"'$  (1.88 m)   Wt 263 lb 4 oz (119.4 kg)   SpO2 98%   BMI 33.80 kg/m    Physical Exam Vitals and nursing note reviewed.  Constitutional:      Appearance: Normal appearance. He is obese.  HENT:     Head: Normocephalic and atraumatic.     Right Ear: External ear normal.     Left Ear: External ear normal.     Nose: Nose normal.     Mouth/Throat:     Mouth: Mucous membranes are moist.  Eyes:     Conjunctiva/sclera: Conjunctivae normal.     Pupils: Pupils are equal, round, and reactive to light.  Cardiovascular:     Rate and Rhythm: Normal rate.  Pulmonary:     Effort: Pulmonary effort is normal.  Musculoskeletal:        General: No swelling, tenderness or deformity.     Comments: Diminished ROM of L spine, negative SLR bilaterally.  Skin:    General: Skin is warm.     Capillary Refill: Capillary refill takes less than 2 seconds.  Neurological:     General: No focal deficit present.     Mental Status: He is alert and oriented to person,  place, and time. Mental status is at baseline.  Psychiatric:        Mood and Affect: Mood normal.        Behavior: Behavior normal.        Thought Content: Thought content normal.        Judgment: Judgment normal.    No results found for any visits on 09/29/22.      Assessment & Plan:   Problem List Items Addressed This Visit       Nervous and Auditory   Left lumbar radiculitis - Primary   Relevant Medications   lidocaine (LIDODERM) 5 %   baclofen (LIORESAL) 10 MG tablet   methylPREDNISolone acetate (DEPO-MEDROL) injection 80 mg   Other Relevant Orders   DG Lumbar Spine Complete   Ambulatory referral to Physical Therapy    Meds ordered this encounter  Medications   lidocaine (LIDODERM) 5 %    Sig: Place 1 patch onto the skin every 12 (twelve) hours. Remove & Discard patch within 12 hours or as directed by MD     Dispense:  10 patch    Refill:  0   baclofen (LIORESAL) 10 MG tablet    Sig: Take 1 tablet (10 mg total) by mouth 3 (three) times daily.    Dispense:  30 each    Refill:  0   methylPREDNISolone acetate (DEPO-MEDROL) injection 80 mg  Suspect lumbar radiculopathy exacerbation Depo 80 mg IM given today in the office. Pt already taking Celebrex for knee pain. Will add muscle relaxer and lidoderm patch. Re-image L spine today and refer to PT.  Discussed with pt today on natural history and symptoms of his condition and that he may have exacerbations from time to time. If PT doesn't improve in 6-8 weeks his symptoms, will need to proceed with MRI of L spine.   No follow-ups on file.  Leeanne Rio, MD

## 2022-10-13 NOTE — Telephone Encounter (Signed)
Benefits Investigation Details received from [employer] Injection: Orthovisc PA required: Yes May fill through: [employer] and [place] Copay/Coinsurance: 20% Product Copay: 20% Administration Coinsurance: 20% Deductible: $1500 (met: $1500) Out of Pocket Max: $7000 (met: $3548.55)

## 2022-10-13 NOTE — Telephone Encounter (Signed)
Patient was notified of approval and already has been scheduled for his injections task complete.

## 2022-10-17 ENCOUNTER — Ambulatory Visit (INDEPENDENT_AMBULATORY_CARE_PROVIDER_SITE_OTHER): Payer: 59 | Admitting: Sports Medicine

## 2022-10-17 ENCOUNTER — Ambulatory Visit (INDEPENDENT_AMBULATORY_CARE_PROVIDER_SITE_OTHER): Payer: 59

## 2022-10-17 DIAGNOSIS — M1711 Unilateral primary osteoarthritis, right knee: Secondary | ICD-10-CM

## 2022-10-17 DIAGNOSIS — M11269 Other chondrocalcinosis, unspecified knee: Secondary | ICD-10-CM

## 2022-10-17 MED ORDER — HYALURONAN 30 MG/2ML IX SOSY
30.0000 mg | PREFILLED_SYRINGE | Freq: Once | INTRA_ARTICULAR | Status: AC
  Administered 2022-10-17: 30 mg via INTRA_ARTICULAR

## 2022-10-17 NOTE — Progress Notes (Signed)
    Procedures performed today:    Procedure: Real-time Ultrasound Guided injection of the left knee Device: Samsung HS60  Verbal informed consent obtained.  Time-out conducted.  Noted no overlying erythema, induration, or other signs of local infection.  Skin prepped in a sterile fashion.  Local anesthesia: Topical Ethyl chloride.  With sterile technique and under real time ultrasound guidance: No effusion noted, 30 mg/2 mL of OrthoVisc (sodium hyaluronate) in a prefilled syringe was injected easily into the knee through a 22-gauge needle. Completed without difficulty  Advised to call if fevers/chills, erythema, induration, drainage, or persistent bleeding.  Images permanently stored and available for review in PACS.  Impression: Technically successful ultrasound guided injection.  Procedure: Real-time Ultrasound Guided injection of the right knee Device: Samsung HS60  Verbal informed consent obtained.  Time-out conducted.  Noted no overlying erythema, induration, or other signs of local infection.  Skin prepped in a sterile fashion.  Local anesthesia: Topical Ethyl chloride.  With sterile technique and under real time ultrasound guidance: No effusion noted, 30 mg/2 mL of OrthoVisc (sodium hyaluronate) in a prefilled syringe was injected easily into the knee through a 22-gauge needle. Completed without difficulty  Advised to call if fevers/chills, erythema, induration, drainage, or persistent bleeding.  Images permanently stored and available for review in PACS.  Impression: Technically successful ultrasound guided injection.  Independent interpretation of notes and tests performed by another provider:   None.  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 Continue Celebrex. Orthovisc No. 1 of 4 today both knees, return in 1 week for #2 of  4 both knees.    ____________________________________________ Daryl Her. Dianah Field, M.D., ABFM., CAQSM., AME. Primary Care and Sports Medicine [facility]  Adjunct Professor of [place] [employer] [facility] of Medicine  Risk manager

## 2022-10-17 NOTE — Assessment & Plan Note (Signed)
Sirus returns, we started Orthovisc today, he did really well back in 2022. Continue Celebrex. Orthovisc No. 1 of 4 today both knees, return in 1 week for #2 of 4 both knees.

## 2022-10-24 ENCOUNTER — Ambulatory Visit (INDEPENDENT_AMBULATORY_CARE_PROVIDER_SITE_OTHER): Payer: 59

## 2022-10-24 ENCOUNTER — Ambulatory Visit: Payer: 59 | Admitting: Sports Medicine

## 2022-10-24 DIAGNOSIS — M11269 Other chondrocalcinosis, unspecified knee: Secondary | ICD-10-CM | POA: Diagnosis not present

## 2022-10-24 MED ORDER — HYALURONAN 30 MG/2ML IX SOSY
30.0000 mg | PREFILLED_SYRINGE | Freq: Once | INTRA_ARTICULAR | Status: AC
  Administered 2022-10-24: 30 mg via INTRA_ARTICULAR

## 2022-10-24 NOTE — Addendum Note (Signed)
Addended by: Tarri Glenn A on: 10/24/2022 11:34 AM   Modules accepted: Orders

## 2022-10-24 NOTE — Assessment & Plan Note (Signed)
Orthovisc 2 of 4 both knees, return in 1 week for #3 of 4.

## 2022-10-24 NOTE — Progress Notes (Signed)
    Procedures performed today:    Procedure: Real-time Ultrasound Guided injection of the left knee Device: Samsung HS60  Verbal informed consent obtained.  Time-out conducted.  Noted no overlying erythema, induration, or other signs of local infection.  Skin prepped in a sterile fashion.  Local anesthesia: Topical Ethyl chloride.  With sterile technique and under real time ultrasound guidance: No effusion noted, 30 mg/2 mL of OrthoVisc (sodium hyaluronate) in a prefilled syringe was injected easily into the knee through a 22-gauge needle. Completed without difficulty  Advised to call if fevers/chills, erythema, induration, drainage, or persistent bleeding.  Images permanently stored and available for review in PACS.  Impression: Technically successful ultrasound guided injection.   Procedure: Real-time Ultrasound Guided injection of the right knee Device: Samsung HS60  Verbal informed consent obtained.  Time-out conducted.  Noted no overlying erythema, induration, or other signs of local infection.  Skin prepped in a sterile fashion.  Local anesthesia: Topical Ethyl chloride.  With sterile technique and under real time ultrasound guidance: No effusion noted, 30 mg/2 mL of OrthoVisc (sodium hyaluronate) in a prefilled syringe was injected easily into the knee through a 22-gauge needle. Completed without difficulty  Advised to call if fevers/chills, erythema, induration, drainage, or persistent bleeding.  Images permanently stored and available for review in PACS.  Impression: Technically successful ultrasound guided injection.  Independent interpretation of notes and tests performed by another provider:   None.  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    ____________________________________________ Gwen Her. Dianah Field, M.D., ABFM., CAQSM.,  AME. Primary Care and Sports Medicine [place]  Adjunct Professor of [place] of [facility] of Medicine  Risk manager

## 2022-10-31 ENCOUNTER — Ambulatory Visit (INDEPENDENT_AMBULATORY_CARE_PROVIDER_SITE_OTHER): Payer: 59

## 2022-10-31 ENCOUNTER — Ambulatory Visit: Payer: 59 | Admitting: Sports Medicine

## 2022-10-31 DIAGNOSIS — M11269 Other chondrocalcinosis, unspecified knee: Secondary | ICD-10-CM

## 2022-10-31 MED ORDER — HYALURONAN 30 MG/2ML IX SOSY
30.0000 mg | PREFILLED_SYRINGE | Freq: Once | INTRA_ARTICULAR | Status: AC
  Administered 2022-10-31: 30 mg via INTRA_ARTICULAR

## 2022-10-31 NOTE — Assessment & Plan Note (Signed)
Orthovisc 3 of 4 both knees, return in 1 week for Orthovisc No. 4 of 4 both knees

## 2022-10-31 NOTE — Progress Notes (Signed)
    Procedures performed today:    Procedure: Real-time Ultrasound Guided injection of the left knee Device: Samsung HS60  Verbal informed consent obtained.  Time-out conducted.  Noted no overlying erythema, induration, or other signs of local infection.  Skin prepped in a sterile fashion.  Local anesthesia: Topical Ethyl chloride.  With sterile technique and under real time ultrasound guidance: No effusion noted, 30 mg/2 mL of OrthoVisc (sodium hyaluronate) in a prefilled syringe was injected easily into the knee through a 22-gauge needle. Completed without difficulty  Advised to call if fevers/chills, erythema, induration, drainage, or persistent bleeding.  Images permanently stored and available for review in PACS.  Impression: Technically successful ultrasound guided injection.   Procedure: Real-time Ultrasound Guided injection of the right knee Device: Samsung HS60  Verbal informed consent obtained.  Time-out conducted.  Noted no overlying erythema, induration, or other signs of local infection.  Skin prepped in a sterile fashion.  Local anesthesia: Topical Ethyl chloride.  With sterile technique and under real time ultrasound guidance: No effusion noted, 30 mg/2 mL of OrthoVisc (sodium hyaluronate) in a prefilled syringe was injected easily into the knee through a 22-gauge needle. Completed without difficulty  Advised to call if fevers/chills, erythema, induration, drainage, or persistent bleeding.  Images permanently stored and available for review in PACS.  Impression: Technically successful ultrasound guided injection.  Independent interpretation of notes and tests performed by another provider:   None.  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 4 of 4 both knees    ____________________________________________ Gwen Her. Dianah Field,  M.D., ABFM., CAQSM., AME. Primary Care and Sports Medicine [place] [facility]  Adjunct Professor of [employer] [employer]  Risk manager

## 2022-11-07 ENCOUNTER — Ambulatory Visit: Payer: 59 | Admitting: Sports Medicine

## 2022-11-07 ENCOUNTER — Ambulatory Visit (INDEPENDENT_AMBULATORY_CARE_PROVIDER_SITE_OTHER): Payer: 59

## 2022-11-07 DIAGNOSIS — M11269 Other chondrocalcinosis, unspecified knee: Secondary | ICD-10-CM

## 2022-11-07 MED ORDER — HYALURONAN 30 MG/2ML IX SOSY
30.0000 mg | PREFILLED_SYRINGE | Freq: Once | INTRA_ARTICULAR | Status: AC
  Administered 2022-11-07: 30 mg via INTRA_ARTICULAR

## 2022-11-07 NOTE — Assessment & Plan Note (Signed)
Orthovisc 4 of 4 both knees, return as needed

## 2022-11-07 NOTE — Progress Notes (Signed)
    Procedures performed today:    Procedure: Real-time Ultrasound Guided injection of the left knee Device: Samsung HS60  Verbal informed consent obtained.  Time-out conducted.  Noted no overlying erythema, induration, or other signs of local infection.  Skin prepped in a sterile fashion.  Local anesthesia: Topical Ethyl chloride.  With sterile technique and under real time ultrasound guidance: No effusion noted, 30 mg/2 mL of OrthoVisc (sodium hyaluronate) in a prefilled syringe was injected easily into the knee through a 22-gauge needle. Completed without difficulty  Advised to call if fevers/chills, erythema, induration, drainage, or persistent bleeding.  Images permanently stored and available for review in PACS.  Impression: Technically successful ultrasound guided injection.   Procedure: Real-time Ultrasound Guided injection of the right knee Device: Samsung HS60  Verbal informed consent obtained.  Time-out conducted.  Noted no overlying erythema, induration, or other signs of local infection.  Skin prepped in a sterile fashion.  Local anesthesia: Topical Ethyl chloride.  With sterile technique and under real time ultrasound guidance: No effusion noted, 30 mg/2 mL of OrthoVisc (sodium hyaluronate) in a prefilled syringe was injected easily into the knee through a 22-gauge needle. Completed without difficulty  Advised to call if fevers/chills, erythema, induration, drainage, or persistent bleeding.  Images permanently stored and available for review in PACS.  Impression: Technically successful ultrasound guided injection.  Independent interpretation of notes and tests performed by another provider:   None.  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 Dianah Field, M.D., ABFM., CAQSM., AME. Primary Care  and Sports Medicine [place] [facility]  Adjunct Professor of [place] [employer] [facility] [employer]  Risk manager

## 2023-05-26 ENCOUNTER — Other Ambulatory Visit: Payer: Self-pay

## 2023-05-26 ENCOUNTER — Ambulatory Visit
Admission: EM | Admit: 2023-05-26 | Discharge: 2023-05-26 | Disposition: A | Discharge status: Home / Self Care | Payer: 59 | Attending: Family Medicine | Admitting: Family Medicine

## 2023-05-26 DIAGNOSIS — H6123 Impacted cerumen, bilateral: Secondary | ICD-10-CM

## 2023-05-26 DIAGNOSIS — H6591 Unspecified nonsuppurative otitis media, right ear: Secondary | ICD-10-CM

## 2023-05-26 MED ORDER — CEFDINIR 300 MG PO CAPS: 300.0000 mg | ORAL_CAPSULE | Freq: Two times a day (BID) | ORAL | 0 refills | Status: AC

## 2023-05-26 NOTE — Discharge Instructions (Addendum)
Advised patient if right ear pain worsens and/or unresolved please start Cefdinir and take with food to completion.  Encouraged increase daily water intake to 64 ounces per day.  Advised if symptoms worsen and/or unresolved please follow-up with PCP or here for further evaluation.

## 2023-05-26 NOTE — ED Triage Notes (Signed)
Pt c/o RT ear fullness x 1 wk.

## 2023-05-26 NOTE — ED Provider Notes (Signed)
[facility]    CSN: [id number] Arrival date & time: 05/26/23  1209      History   Chief Complaint Chief Complaint  Patient presents with   Ear Fullness    RT    HPI Daryl Caldwell is a 57 y.o. male.   HPI Very pleasant 57 year old male presents with right ear fullness/pain x 7 days.  PMH significant for morbid obesity, HTN, and left lumbar radiculitis.  Past Medical History:  Diagnosis Date   Hypercholesteremia    Hypertension     Patient Active Problem List   Diagnosis Date Noted   Status post surgery 09/19/2022   Corn of toe 09/07/2021   Mass of subcutaneous tissue of back 08/30/2021   Laceration of arm, left, initial encounter 04/01/2020   Left lumbar radiculitis 02/23/2020   Dropped arch with navicular prominence, right foot 12/24/2019   Pseudogout and primary osteoarthritis of both knees 10/29/2018   Lateral epicondylitis, left elbow 10/29/2018   Benign essential hypertension 01/24/2014    History reviewed. No pertinent surgical history.     Home Medications    Prior to Admission medications   Medication Sig Start Date End Date Taking? Authorizing Provider  cefdinir (OMNICEF) 300 MG capsule Take 1 capsule (300 mg total) by mouth 2 (two) times daily for 7 days. 05/26/23 06/02/23 Yes Trevor Iha, FNP  aspirin 81 MG chewable tablet Chew by mouth daily.    [provider]  atorvastatin (LIPITOR) 40 MG tablet Take 40 mg by mouth at bedtime. 08/04/21   [provider]  baclofen (LIORESAL) 10 MG tablet Take 1 tablet (10 mg total) by mouth 3 (three) times daily. 09/29/22   Suzan Slick, MD  carbamide peroxide (DEBROX) 6.5 % OTIC solution Place 5 drops into both ears 2 (two) times daily. 07/19/20   Lurene Shadow, PA-C  celecoxib (CELEBREX) 200 MG capsule Take 1 capsule (200 mg total) by mouth 2 (two) times daily. 09/27/22   Monica Becton, MD  colchicine 0.6 MG tablet Take 1 tablet by mouth once daily 10/23/22    Monica Becton, MD  fexofenadine [facility] ALLERGY) 180 MG tablet Take 1 tablet (180 mg total) by mouth daily for 15 days. 10/02/21 10/17/21  Trevor Iha, FNP  Levocetirizine Dihydrochloride (XYZAL PO) Take by mouth.    [provider]  lidocaine (LIDODERM) 5 % Place 1 patch onto the skin every 12 (twelve) hours. Remove & Discard patch within 12 hours or as directed by MD 09/29/22   Suzan Slick, MD  lisinopril (PRINIVIL,ZESTRIL) 20 MG tablet Take 20 mg by mouth daily.    [provider]  metoprolol succinate (TOPROL-XL) 50 MG 24 hr tablet Take 50 mg by mouth daily. Take with or immediately following a meal.    [provider]  ondansetron (ZOFRAN) 4 MG tablet Take 1 tablet (4 mg total) by mouth every 8 (eight) hours as needed for nausea or vomiting. 10/18/21   Louann Sjogren, DPM  pantoprazole (PROTONIX) 40 MG tablet Take 40 mg by mouth daily. 06/15/21   [provider]    Family History Family History  Problem Relation Age of Onset   Osteoarthritis Mother    Parkinson's disease Father    Dementia Father     Social History Social History   Tobacco Use   Smoking status: Every Day    Current packs/day: 1.00    Average packs/day: 1 pack/day for 35.0 years (35.0 ttl pk-yrs)    Types: Cigarettes  Smokeless tobacco: Never  Substance Use Topics   Alcohol use: Yes    Comment: "1 pint of whiskey per day"   Drug use: Yes    Types: Marijuana    Comment: "14 grams per week"     Allergies   Penicillins   Review of Systems Review of Systems  HENT:  Positive for ear pain.   All other systems reviewed and are negative.    Physical Exam Triage Vital Signs ED Triage Vitals  Encounter Vitals Group     BP      Systolic BP Percentile      Diastolic BP Percentile      Pulse      Resp      Temp      Temp src      SpO2      Weight      Height      Head Circumference      Peak Flow      Pain Score      Pain Loc      Pain  Education      Exclude from Growth Chart    No data found.  Updated Vital Signs BP (!) 163/98 (BP Location: Right Arm)   Pulse 77   Temp 97.6 F (36.4 C) (Oral)   Resp 17   SpO2 98%   Physical Exam Vitals and nursing note reviewed.  Constitutional:      Appearance: Normal appearance. He is normal weight.  HENT:     Head: Normocephalic and atraumatic.     Right Ear: Tympanic membrane, ear canal and external ear normal.     Left Ear: Tympanic membrane, ear canal and external ear normal.     Ears:     Comments: Bilateral EAC's occluded with excessive cerumen unable to visualize either TM.  Post bilateral ear lavage: Left EAC: clear; Left TM: Clear, retracted with good light reflex and scant serous effusions noted; Right EAC: Clear with residual trace cerumen; Right TM: Red rimmed, retracted    Nose: Nose normal.     Mouth/Throat:     Mouth: Mucous membranes are moist.     Pharynx: Oropharynx is clear.  Eyes:     Extraocular Movements: Extraocular movements intact.     Conjunctiva/sclera: Conjunctivae normal.     Pupils: Pupils are equal, round, and reactive to light.  Cardiovascular:     Rate and Rhythm: Normal rate and regular rhythm.     Pulses: Normal pulses.     Heart sounds: Normal heart sounds.  Pulmonary:     Effort: Pulmonary effort is normal.     Breath sounds: Normal breath sounds. No wheezing, rhonchi or rales.  Musculoskeletal:        General: Normal range of motion.     Cervical back: Normal range of motion and neck supple.  Skin:    General: Skin is warm and dry.  Neurological:     General: No focal deficit present.     Mental Status: He is alert and oriented to person, place, and time. Mental status is at baseline.  Psychiatric:        Mood and Affect: Mood normal.        Behavior: Behavior normal.        Thought Content: Thought content normal.      UC Treatments / Results  Labs (all labs ordered are listed, but only abnormal results are  displayed) Labs Reviewed - No data to display  EKG  Radiology No results found.  Procedures Procedures (including critical care time)  Medications Ordered in UC Medications - No data to display  Initial Impression / Assessment and Plan / UC Course  I have reviewed the triage vital signs and the nursing notes.  Pertinent labs & imaging results that were available during my care of the patient were reviewed by me and considered in my medical decision making (see chart for details).     MDM: 1.  Right otitis media with effusion-Rx'd Cefdinir 300 mg capsule twice daily x 7 days 2.  Bilateral impacted cerumen-resolved with bilateral ear lavage. Advised patient if right ear pain worsens and/or unresolved please start Cefdinir and take with food to completion.  Encouraged increase daily water intake to 64 ounces per day.  Advised if symptoms worsen and/or unresolved please follow-up with PCP or here for further evaluation.  Patient discharged home, hemodynamically stable.  Final Clinical Impressions(s) / UC Diagnoses   Final diagnoses:  Bilateral impacted cerumen  Right otitis media with effusion     Discharge Instructions      Advised patient if right ear pain worsens and/or unresolved please start Cefdinir and take with food to completion.  Encouraged increase daily water intake to 64 ounces per day.  Advised if symptoms worsen and/or unresolved please follow-up with PCP or here for further evaluation.     ED Prescriptions     Medication Sig Dispense Auth. Provider   cefdinir (OMNICEF) 300 MG capsule Take 1 capsule (300 mg total) by mouth 2 (two) times daily for 7 days. 14 capsule Trevor Iha, FNP      PDMP not reviewed this encounter.   Trevor Iha, FNP 05/26/23 1330

## 2023-09-06 ENCOUNTER — Ambulatory Visit (INDEPENDENT_AMBULATORY_CARE_PROVIDER_SITE_OTHER): Payer: 59

## 2023-09-06 ENCOUNTER — Ambulatory Visit: Payer: 59 | Admitting: Sports Medicine

## 2023-09-06 ENCOUNTER — Encounter: Payer: Self-pay | Admitting: Sports Medicine

## 2023-09-06 ENCOUNTER — Other Ambulatory Visit (INDEPENDENT_AMBULATORY_CARE_PROVIDER_SITE_OTHER): Payer: 59

## 2023-09-06 DIAGNOSIS — M11269 Other chondrocalcinosis, unspecified knee: Secondary | ICD-10-CM

## 2023-09-06 DIAGNOSIS — M47816 Spondylosis without myelopathy or radiculopathy, lumbar region: Secondary | ICD-10-CM | POA: Diagnosis not present

## 2023-09-06 DIAGNOSIS — M4317 Spondylolisthesis, lumbosacral region: Secondary | ICD-10-CM | POA: Diagnosis not present

## 2023-09-06 DIAGNOSIS — M5126 Other intervertebral disc displacement, lumbar region: Secondary | ICD-10-CM | POA: Diagnosis not present

## 2023-09-06 MED ORDER — HYALURONAN 30 MG/2ML IX SOSY
30.0000 mg | PREFILLED_SYRINGE | Freq: Once | INTRA_ARTICULAR | Status: AC
  Administered 2023-09-06: 30 mg via INTRA_ARTICULAR

## 2023-09-06 NOTE — Assessment & Plan Note (Signed)
Pleasant 57 year old male, increasing axial back pain, known multilevel lumbar spondylosis with retrolisthesis at multiple levels. Adding updated x-rays, home conditioning, return to see me in 6 weeks, we will consider MRI for epidural planning if not better.

## 2023-09-06 NOTE — Progress Notes (Signed)
    Procedures performed today:    Procedure: Real-time Ultrasound Guided injection of the left knee Device: Samsung HS60  Verbal informed consent obtained.  Time-out conducted.  Noted no overlying erythema, induration, or other signs of local infection.  Skin prepped in a sterile fashion.  Local anesthesia: Topical Ethyl chloride.  With sterile technique and under real time ultrasound guidance: No effusion noted, 30 mg/2 mL of OrthoVisc (sodium hyaluronate) in a prefilled syringe was injected easily into the knee through a 22-gauge needle. Completed without difficulty  Advised to call if fevers/chills, erythema, induration, drainage, or persistent bleeding.  Images permanently stored and available for review in PACS.  Impression: Technically successful ultrasound guided injection.   Procedure: Real-time Ultrasound Guided injection of the right knee Device: Samsung HS60  Verbal informed consent obtained.  Time-out conducted.  Noted no overlying erythema, induration, or other signs of local infection.  Skin prepped in a sterile fashion.  Local anesthesia: Topical Ethyl chloride.  With sterile technique and under real time ultrasound guidance: No effusion noted, 30 mg/2 mL of OrthoVisc (sodium hyaluronate) in a prefilled syringe was injected easily into the knee through a 22-gauge needle. Completed without difficulty  Advised to call if fevers/chills, erythema, induration, drainage, or persistent bleeding.  Images permanently stored and available for review in PACS.  Impression: Technically successful ultrasound guided injection.  Independent interpretation of notes and tests performed by another provider:   None.  Brief History, Exam, Impression, and Recommendations:    Pseudogout and primary osteoarthritis of both knees Did really well with Orthovisc approximately 10 months ago, today we made the decision to restart bilaterally, return in 1 week for Orthovisc No. 2 of 4 both  knees.  Lumbar spondylosis Pleasant 57 year old male, increasing axial back pain, known multilevel lumbar spondylosis with retrolisthesis at multiple levels. Adding updated x-rays, home conditioning, return to see me in 6 weeks, we will consider MRI for epidural planning if not better.  Chronic process with exacerbation and pharmacologic intervention  ____________________________________________ Ihor Austin. Benjamin Stain, M.D., ABFM., CAQSM., AME. Primary Care and Sports Medicine [place] [facility]  Adjunct Professor of Family Medicine  [place] [employer] [facility] of Medicine  Restaurant manager, fast food

## 2023-09-06 NOTE — Addendum Note (Signed)
Addended by: Carren Rang A on: 09/06/2023 03:46 PM   Modules accepted: Orders

## 2023-09-06 NOTE — Assessment & Plan Note (Addendum)
Did really well with Orthovisc approximately 10 months ago, today we made the decision to restart bilaterally, return in 1 week for Orthovisc No. 2 of 4 both knees.

## 2023-09-13 ENCOUNTER — Encounter: Payer: Self-pay | Admitting: Sports Medicine

## 2023-09-13 ENCOUNTER — Other Ambulatory Visit (INDEPENDENT_AMBULATORY_CARE_PROVIDER_SITE_OTHER): Payer: 59

## 2023-09-13 ENCOUNTER — Ambulatory Visit: Payer: 59 | Admitting: Sports Medicine

## 2023-09-13 DIAGNOSIS — M65321 Trigger finger, right index finger: Secondary | ICD-10-CM

## 2023-09-13 DIAGNOSIS — M11269 Other chondrocalcinosis, unspecified knee: Secondary | ICD-10-CM | POA: Diagnosis not present

## 2023-09-13 DIAGNOSIS — M47816 Spondylosis without myelopathy or radiculopathy, lumbar region: Secondary | ICD-10-CM

## 2023-09-13 MED ORDER — HYALURONAN 30 MG/2ML IX SOSY
30.0000 mg | PREFILLED_SYRINGE | Freq: Once | INTRA_ARTICULAR | Status: AC
  Administered 2023-09-13: 30 mg via INTRA_ARTICULAR

## 2023-09-13 NOTE — Assessment & Plan Note (Signed)
Painful triggering particularly in the morning, adding home PT, we discussed the anatomy and pathophysiology, return to see me as needed for this.

## 2023-09-13 NOTE — Addendum Note (Signed)
Addended by: Carren Rang A on: 09/13/2023 04:32 PM   Modules accepted: Orders

## 2023-09-13 NOTE — Assessment & Plan Note (Signed)
I saw Daryl Caldwell with increasing low back pain approximately a week ago, x-rays did show multilevel DDD and L5-S1 spondylolisthesis. He has improved on his own. Return to see me as needed for this.

## 2023-09-13 NOTE — Progress Notes (Signed)
    Procedures performed today:    Procedure: Real-time Ultrasound Guided injection of the left knee Device: Samsung HS60  Verbal informed consent obtained.  Time-out conducted.  Noted no overlying erythema, induration, or other signs of local infection.  Skin prepped in a sterile fashion.  Local anesthesia: Topical Ethyl chloride.  With sterile technique and under real time ultrasound guidance: No effusion noted, 30 mg/2 mL of OrthoVisc (sodium hyaluronate) in a prefilled syringe was injected easily into the knee through a 22-gauge needle. Completed without difficulty  Advised to call if fevers/chills, erythema, induration, drainage, or persistent bleeding.  Images permanently stored and available for review in PACS.  Impression: Technically successful ultrasound guided injection.   Procedure: Real-time Ultrasound Guided injection of the right knee Device: Samsung HS60  Verbal informed consent obtained.  Time-out conducted.  Noted no overlying erythema, induration, or other signs of local infection.  Skin prepped in a sterile fashion.  Local anesthesia: Topical Ethyl chloride.  With sterile technique and under real time ultrasound guidance: No effusion noted, 30 mg/2 mL of OrthoVisc (sodium hyaluronate) in a prefilled syringe was injected easily into the knee through a 22-gauge needle. Completed without difficulty  Advised to call if fevers/chills, erythema, induration, drainage, or persistent bleeding.  Images permanently stored and available for review in PACS.  Impression: Technically successful ultrasound guided injection.  Independent interpretation of notes and tests performed by another provider:   None.  Brief History, Exam, Impression, and Recommendations:    Pseudogout and primary osteoarthritis of both knees Did well with Orthovisc approximately 11 months ago, today we did Orthovisc No. 2 of 4 both knees, return in 1 week for #3 of 4.  Lumbar spondylosis I saw Kathlene November  with increasing low back pain approximately a week ago, x-rays did show multilevel DDD and L5-S1 spondylolisthesis. He has improved on his own. Return to see me as needed for this.  Trigger finger, right index finger Painful triggering particularly in the morning, adding home PT, we discussed the anatomy and pathophysiology, return to see me as needed for this.     ____________________________________________ Ihor Austin. Benjamin Stain, M.D., ABFM., CAQSM., AME. Primary Care and Sports Medicine [place] [facility]  Adjunct Professor of Family Medicine  [place] [employer] [facility] of Medicine  Restaurant manager, fast food

## 2023-09-13 NOTE — Assessment & Plan Note (Signed)
Did well with Orthovisc approximately 11 months ago, today we did Orthovisc No. 2 of 4 both knees, return in 1 week for #3 of 4.

## 2023-09-20 ENCOUNTER — Other Ambulatory Visit (INDEPENDENT_AMBULATORY_CARE_PROVIDER_SITE_OTHER): Payer: 59

## 2023-09-20 ENCOUNTER — Ambulatory Visit: Payer: 59 | Admitting: Sports Medicine

## 2023-09-20 DIAGNOSIS — M11269 Other chondrocalcinosis, unspecified knee: Secondary | ICD-10-CM

## 2023-09-20 MED ORDER — HYALURONAN 30 MG/2ML IX SOSY
30.0000 mg | PREFILLED_SYRINGE | Freq: Once | INTRA_ARTICULAR | Status: AC
  Administered 2023-09-20: 30 mg via INTRA_ARTICULAR

## 2023-09-20 NOTE — Progress Notes (Signed)
    Procedures performed today:    Procedure: Real-time Ultrasound Guided injection of the left knee Device: Samsung HS60  Verbal informed consent obtained.  Time-out conducted.  Noted no overlying erythema, induration, or other signs of local infection.  Skin prepped in a sterile fashion.  Local anesthesia: Topical Ethyl chloride.  With sterile technique and under real time ultrasound guidance: No effusion noted, 30 mg/2 mL of OrthoVisc (sodium hyaluronate) in a prefilled syringe was injected easily into the knee through a 22-gauge needle. Completed without difficulty  Advised to call if fevers/chills, erythema, induration, drainage, or persistent bleeding.  Images permanently stored and available for review in PACS.  Impression: Technically successful ultrasound guided injection.   Procedure: Real-time Ultrasound Guided injection of the right knee Device: Samsung HS60  Verbal informed consent obtained.  Time-out conducted.  Noted no overlying erythema, induration, or other signs of local infection.  Skin prepped in a sterile fashion.  Local anesthesia: Topical Ethyl chloride.  With sterile technique and under real time ultrasound guidance: No effusion noted, 30 mg/2 mL of OrthoVisc (sodium hyaluronate) in a prefilled syringe was injected easily into the knee through a 22-gauge needle. Completed without difficulty  Advised to call if fevers/chills, erythema, induration, drainage, or persistent bleeding.  Images permanently stored and available for review in PACS.  Impression: Technically successful ultrasound guided injection.  Independent interpretation of notes and tests performed by another provider:   None.  Brief History, Exam, Impression, and Recommendations:    Pseudogout and primary osteoarthritis of both knees Orthovisc 3 of 4 both knees, return in 1 week for #4 of 4.    ____________________________________________ Daryl Caldwell. Benjamin Stain, M.D., ABFM., CAQSM.,  AME. Primary Care and Sports Medicine  [facility]  Adjunct Professor of Family Medicine  [place] [employer] [facility] [employer]  Restaurant manager, fast food

## 2023-09-20 NOTE — Assessment & Plan Note (Signed)
Orthovisc 3 of 4 both knees, return in 1 week for #4 of 4 

## 2023-09-20 NOTE — Addendum Note (Signed)
Addended by: Carren Rang A on: 09/20/2023 10:43 AM   Modules accepted: Orders

## 2023-09-27 ENCOUNTER — Other Ambulatory Visit (INDEPENDENT_AMBULATORY_CARE_PROVIDER_SITE_OTHER): Payer: 59

## 2023-09-27 ENCOUNTER — Ambulatory Visit: Payer: 59 | Admitting: Sports Medicine

## 2023-09-27 ENCOUNTER — Encounter: Payer: Self-pay | Admitting: Sports Medicine

## 2023-09-27 DIAGNOSIS — M1711 Unilateral primary osteoarthritis, right knee: Secondary | ICD-10-CM | POA: Diagnosis not present

## 2023-09-27 DIAGNOSIS — R200 Anesthesia of skin: Secondary | ICD-10-CM | POA: Insufficient documentation

## 2023-09-27 DIAGNOSIS — M11269 Other chondrocalcinosis, unspecified knee: Secondary | ICD-10-CM

## 2023-09-27 DIAGNOSIS — R202 Paresthesia of skin: Secondary | ICD-10-CM | POA: Diagnosis not present

## 2023-09-27 MED ORDER — HYALURONAN 30 MG/2ML IX SOSY
30.0000 mg | PREFILLED_SYRINGE | Freq: Once | INTRA_ARTICULAR | Status: AC
  Administered 2023-09-27: 30 mg via INTRA_ARTICULAR

## 2023-09-27 NOTE — Assessment & Plan Note (Signed)
Daryl Caldwell is also complaining of bilateral hand numbness and tingling worse at night, right worse than left, positive Tinel's and Phalen signs on exam today. Explained the pathophysiology, he will do bilateral nocturnal splinting, home conditioning, return to see me in 6 weeks for this, bilateral median nerve hydrodissections if not better.

## 2023-09-27 NOTE — Progress Notes (Signed)
    Procedures performed today:    Procedure: Real-time Ultrasound Guided injection of the left knee Device: Samsung HS60  Verbal informed consent obtained.  Time-out conducted.  Noted no overlying erythema, induration, or other signs of local infection.  Skin prepped in a sterile fashion.  Local anesthesia: Topical Ethyl chloride.  With sterile technique and under real time ultrasound guidance: No effusion noted, 30 mg/2 mL of OrthoVisc (sodium hyaluronate) in a prefilled syringe was injected easily into the knee through a 22-gauge needle. Completed without difficulty  Advised to call if fevers/chills, erythema, induration, drainage, or persistent bleeding.  Images permanently stored and available for review in PACS.  Impression: Technically successful ultrasound guided injection.   Procedure: Real-time Ultrasound Guided injection of the right knee Device: Samsung HS60  Verbal informed consent obtained.  Time-out conducted.  Noted no overlying erythema, induration, or other signs of local infection.  Skin prepped in a sterile fashion.  Local anesthesia: Topical Ethyl chloride.  With sterile technique and under real time ultrasound guidance: No effusion noted, 30 mg/2 mL of OrthoVisc (sodium hyaluronate) in a prefilled syringe was injected easily into the knee through a 22-gauge needle. Completed without difficulty  Advised to call if fevers/chills, erythema, induration, drainage, or persistent bleeding.  Images permanently stored and available for review in PACS.  Impression: Technically successful ultrasound guided injection.  Independent interpretation of notes and tests performed by another provider:   None.  Brief History, Exam, Impression, and Recommendations:    Pseudogout and primary osteoarthritis of both knees Orthovisc 4 of 4 both knees, return in 6 weeks as needed  Numbness and tingling in both hands Daryl Caldwell is also complaining of bilateral hand numbness and tingling  worse at night, right worse than left, positive Tinel's and Phalen signs on exam today. Explained the pathophysiology, he will do bilateral nocturnal splinting, home conditioning, return to see me in 6 weeks for this, bilateral median nerve hydrodissections if not better.    ____________________________________________ Daryl Caldwell. Benjamin Stain, M.D., ABFM., CAQSM., AME. Primary Care and Sports Medicine [facility]  Adjunct Professor of Family Medicine  [place] [employer]  Restaurant manager, fast food

## 2023-09-27 NOTE — Progress Notes (Signed)
oert

## 2023-09-27 NOTE — Assessment & Plan Note (Signed)
Orthovisc 4 of 4 both knees, return in 6 weeks as needed

## 2023-10-01 ENCOUNTER — Other Ambulatory Visit: Payer: Self-pay | Admitting: Sports Medicine

## 2023-10-01 DIAGNOSIS — M1711 Unilateral primary osteoarthritis, right knee: Secondary | ICD-10-CM

## 2023-10-01 DIAGNOSIS — M11269 Other chondrocalcinosis, unspecified knee: Secondary | ICD-10-CM

## 2023-10-08 DIAGNOSIS — E785 Hyperlipidemia, unspecified: Secondary | ICD-10-CM | POA: Diagnosis not present

## 2023-10-08 DIAGNOSIS — I10 Essential (primary) hypertension: Secondary | ICD-10-CM | POA: Diagnosis not present

## 2023-10-18 ENCOUNTER — Other Ambulatory Visit: Payer: Self-pay | Admitting: Family Medicine

## 2023-10-18 ENCOUNTER — Other Ambulatory Visit: Payer: Self-pay

## 2023-10-18 DIAGNOSIS — M11261 Other chondrocalcinosis, right knee: Secondary | ICD-10-CM

## 2023-10-18 MED ORDER — COLCHICINE 0.6 MG PO TABS: 0.6000 mg | ORAL_TABLET | Freq: Every day | ORAL | 3 refills | Status: AC

## 2023-10-18 MED ORDER — COLCHICINE 0.6 MG PO TABS: 0.6000 mg | ORAL_TABLET | Freq: Every day | ORAL | 3 refills | Status: DC

## 2023-10-18 NOTE — Telephone Encounter (Signed)
Copied from CRM [id number]. Topic: Clinical - Prescription Issue >> Oct 18, 2023  2:54 PM Nila Nephew wrote: Reason for CRM: Patient calling to request that colchicine 0.6 MG tablet be resent, as pharmacy states that prescription has likely expired.

## 2023-10-29 ENCOUNTER — Other Ambulatory Visit: Payer: Self-pay | Admitting: Sports Medicine

## 2023-10-29 DIAGNOSIS — M1711 Unilateral primary osteoarthritis, right knee: Secondary | ICD-10-CM

## 2023-10-29 DIAGNOSIS — M11269 Other chondrocalcinosis, unspecified knee: Secondary | ICD-10-CM

## 2023-11-08 ENCOUNTER — Ambulatory Visit: Payer: 59 | Admitting: Sports Medicine

## 2023-12-04 ENCOUNTER — Other Ambulatory Visit: Payer: Self-pay | Admitting: Sports Medicine

## 2023-12-04 DIAGNOSIS — M11269 Other chondrocalcinosis, unspecified knee: Secondary | ICD-10-CM

## 2023-12-04 DIAGNOSIS — M1711 Unilateral primary osteoarthritis, right knee: Secondary | ICD-10-CM

## 2023-12-31 ENCOUNTER — Other Ambulatory Visit: Payer: Self-pay | Admitting: Sports Medicine

## 2023-12-31 DIAGNOSIS — M1711 Unilateral primary osteoarthritis, right knee: Secondary | ICD-10-CM

## 2023-12-31 DIAGNOSIS — M11269 Other chondrocalcinosis, unspecified knee: Secondary | ICD-10-CM

## 2024-01-26 ENCOUNTER — Other Ambulatory Visit: Payer: Self-pay | Admitting: Sports Medicine

## 2024-01-26 DIAGNOSIS — M11269 Other chondrocalcinosis, unspecified knee: Secondary | ICD-10-CM

## 2024-01-26 DIAGNOSIS — M1711 Unilateral primary osteoarthritis, right knee: Secondary | ICD-10-CM

## 2024-02-20 ENCOUNTER — Other Ambulatory Visit: Payer: Self-pay | Admitting: Sports Medicine

## 2024-02-20 DIAGNOSIS — M11261 Other chondrocalcinosis, right knee: Secondary | ICD-10-CM

## 2024-02-21 ENCOUNTER — Other Ambulatory Visit: Payer: Self-pay | Admitting: Sports Medicine

## 2024-02-21 DIAGNOSIS — M1711 Unilateral primary osteoarthritis, right knee: Secondary | ICD-10-CM

## 2024-02-21 DIAGNOSIS — M11269 Other chondrocalcinosis, unspecified knee: Secondary | ICD-10-CM

## 2024-03-20 ENCOUNTER — Encounter: Payer: Self-pay | Admitting: Sports Medicine

## 2024-03-20 ENCOUNTER — Ambulatory Visit

## 2024-03-20 ENCOUNTER — Ambulatory Visit: Admitting: Sports Medicine

## 2024-03-20 DIAGNOSIS — M545 Low back pain, unspecified: Secondary | ICD-10-CM

## 2024-03-20 DIAGNOSIS — M47816 Spondylosis without myelopathy or radiculopathy, lumbar region: Secondary | ICD-10-CM

## 2024-03-20 DIAGNOSIS — M5126 Other intervertebral disc displacement, lumbar region: Secondary | ICD-10-CM | POA: Diagnosis not present

## 2024-03-20 DIAGNOSIS — M549 Dorsalgia, unspecified: Secondary | ICD-10-CM | POA: Diagnosis not present

## 2024-03-20 DIAGNOSIS — M48061 Spinal stenosis, lumbar region without neurogenic claudication: Secondary | ICD-10-CM | POA: Diagnosis not present

## 2024-03-20 MED ORDER — PREDNISONE 50 MG PO TABS: ORAL_TABLET | ORAL | 0 refills | Status: DC

## 2024-03-20 NOTE — Progress Notes (Signed)
    Procedures performed today:    None.  Independent interpretation of notes and tests performed by another provider:   None.  Brief History, Exam, Impression, and Recommendations:    Lumbar spondylosis Very pleasant 59 year old male, increasing low back pain with radiation down both legs to all the toes, worse with extending his spine, no red flag symptoms, suspect spinal stenosis. We talked about this several times over the past several years, adding prednisone , updated x-rays, formal PT, return to see me in 6 weeks.  Chronic process with exacerbation and pharmacologic intervention  ____________________________________________ Joselyn Nicely. Sandy Crumb, M.D., ABFM., CAQSM., AME. Primary Care and Sports Medicine [facility]  Adjunct Professor of [facility] Medicine  [employer]  Restaurant manager, fast food

## 2024-03-20 NOTE — Assessment & Plan Note (Signed)
 Very pleasant 58 year old male, increasing low back pain with radiation down both legs to all the toes, worse with extending his spine, no red flag symptoms, suspect spinal stenosis. We talked about this several times over the past several years, adding prednisone , updated x-rays, formal PT, return to see me in 6 weeks.

## 2024-03-26 ENCOUNTER — Other Ambulatory Visit: Payer: Self-pay | Admitting: Sports Medicine

## 2024-03-26 DIAGNOSIS — M47816 Spondylosis without myelopathy or radiculopathy, lumbar region: Secondary | ICD-10-CM

## 2024-03-26 NOTE — Telephone Encounter (Signed)
 Copied from CRM [id number]. Topic: Clinical - Medication Refill >> Mar 26, 2024 12:48 PM Merlynn A wrote: Medication: predniSONE  (DELTASONE ) 50 MG tablet  Has the patient contacted their pharmacy? Yes (Agent: If no, request that the patient contact the pharmacy for the refill. If patient does not wish to contact the pharmacy document the reason why and proceed with request.) (Agent: If yes, when and what did the pharmacy advise?)  This is the patient's preferred pharmacy:  [facility] [address] [place] - [address] [place] [address] Phone: [phone] Fax: [phone]   Is this the correct pharmacy for this prescription? Yes If no, delete pharmacy and type the correct one.   Has the prescription been filled recently? No  Is the patient out of the medication? Yes  Has the patient been seen for an appointment in the last year OR does the patient have an upcoming appointment? Yes  Can we respond through MyChart? Yes  Agent: Please be advised that Rx refills may take up to 3 business days. We ask that you follow-up with your pharmacy.

## 2024-03-27 MED ORDER — PREDNISONE 10 MG (48) PO TBPK: ORAL_TABLET | Freq: Every day | ORAL | 0 refills | Status: DC

## 2024-03-31 ENCOUNTER — Ambulatory Visit: Payer: Self-pay | Admitting: Sports Medicine

## 2024-04-08 ENCOUNTER — Telehealth: Payer: Self-pay | Admitting: Sports Medicine

## 2024-04-08 DIAGNOSIS — M47816 Spondylosis without myelopathy or radiculopathy, lumbar region: Secondary | ICD-10-CM

## 2024-04-08 NOTE — Telephone Encounter (Signed)
 Copied from CRM [id number]. Topic: Clinical - Medication Refill >> Apr 08, 2024  8:38 AM Cherylann S wrote: Medication: predniSONE  (STERAPRED UNI-PAK 48 TAB) 10 MG (48) TBPK tablet  Would like another prescription as the inflammation still has not went away  Has the patient contacted their pharmacy? No (Agent: If no, request that the patient contact the pharmacy for the refill. If patient does not wish to contact the pharmacy document the reason why and proceed with request.) (Agent: If yes, when and what did the pharmacy advise?)  This is the patient's preferred pharmacy:  [facility] [address], [place] - [address] [place] [address] Phone: [phone] Fax: [phone]  Is this the correct pharmacy for this prescription? Yes If no, delete pharmacy and type the correct one.   Has the prescription been filled recently? Yes  Is the patient out of the medication? Yes  Has the patient been seen for an appointment in the last year OR does the patient have an upcoming appointment? Yes  Can we respond through MyChart? Yes  Agent: Please be advised that Rx refills may take up to 3 business days. We ask that you follow-up with your pharmacy.

## 2024-04-11 ENCOUNTER — Other Ambulatory Visit: Payer: Self-pay | Admitting: Sports Medicine

## 2024-04-11 ENCOUNTER — Other Ambulatory Visit: Payer: Self-pay

## 2024-04-11 DIAGNOSIS — M47816 Spondylosis without myelopathy or radiculopathy, lumbar region: Secondary | ICD-10-CM

## 2024-04-11 NOTE — Telephone Encounter (Signed)
 FYI Only or Action Required?: Action required by provider: medication refill request.  Patient was last seen in primary care on 03/20/2024 by Curtis Debby PARAS, MD.  Called Nurse Triage reporting No chief complaint on file..  Symptoms began today.  Interventions attempted: Nothing.  Symptoms are: stable.  Triage Disposition: No disposition on file.  Patient/caregiver understands and will follow disposition?:    ------------- Patient is requesting a refill of the prednisone .   Confirms that it is working well for his lumbar pain.   Almost out and is requesting more.  Medication pended.   Message routed to the office appropriately.

## 2024-04-11 NOTE — Telephone Encounter (Signed)
 Pt calling back to check the status of the refill request. Pharmacy has not received any response. Can this be expedited to the [facility] pharmacy? Pt says he will not be able to make it through the weekend.

## 2024-04-12 ENCOUNTER — Ambulatory Visit
Admission: EM | Admit: 2024-04-12 | Discharge: 2024-04-12 | Disposition: A | Discharge status: Home / Self Care | Attending: Family Medicine | Admitting: Family Medicine

## 2024-04-12 ENCOUNTER — Other Ambulatory Visit: Payer: Self-pay

## 2024-04-12 DIAGNOSIS — M5442 Lumbago with sciatica, left side: Secondary | ICD-10-CM

## 2024-04-12 DIAGNOSIS — M5441 Lumbago with sciatica, right side: Secondary | ICD-10-CM | POA: Diagnosis not present

## 2024-04-12 DIAGNOSIS — M62830 Muscle spasm of back: Secondary | ICD-10-CM

## 2024-04-12 MED ORDER — METHOCARBAMOL 500 MG PO TABS: 500.0000 mg | ORAL_TABLET | Freq: Three times a day (TID) | ORAL | 0 refills | Status: AC | PRN

## 2024-04-12 MED ORDER — PREDNISONE 10 MG (21) PO TBPK: ORAL_TABLET | Freq: Every day | ORAL | 0 refills | Status: DC

## 2024-04-12 MED ORDER — METHYLPREDNISOLONE ACETATE 80 MG/ML IJ SUSP
80.0000 mg | Freq: Once | INTRAMUSCULAR | Status: AC
  Administered 2024-04-12: 80 mg via INTRAMUSCULAR

## 2024-04-12 NOTE — ED Triage Notes (Signed)
 Pt c/o lower back pain x 1 week. Hx of sciatica. Took some prednisone  last night leftover from last visit. He called PCP yesterday and was told they would send in more prednisone  but it never got sent.

## 2024-04-12 NOTE — Discharge Instructions (Addendum)
 Advised patient to take medication as directed with food completion.  Advised may take Robaxin  daily or as needed for muscle spasms of back.  Encouraged to increase daily water intake to 64 ounces per day while taking this medication.  Advised if symptoms worsen and/or unresolved please follow-up with your PCP or here for further evaluation.

## 2024-04-12 NOTE — ED Provider Notes (Signed)
 Daryl Caldwell    CSN: [id number] Arrival date & time: 04/12/24  1055      History   Chief Complaint Chief Complaint  Patient presents with   Back Pain    Lower    HPI Daryl Caldwell is a 58 y.o. male.   HPI 58 year old male presents with lower back pain x 1 week.  Patient reports history of sciatica.  Patient reports PCP prescribed previous prednisone  taper that helped with back pain.  PMH significant for hypercholesterolemia and HTN.  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 No pertinent surgical history.     Home Medications    Prior to Admission medications   Medication Sig Start Date End Date Taking? Authorizing Provider  methocarbamol  (ROBAXIN ) 500 MG tablet Take 1 tablet (500 mg total) by mouth 3 (three) times daily as needed for up to 7 days. 04/12/24 04/19/24 Yes Teddy Sharper, FNP  predniSONE  (STERAPRED UNI-PAK 21 TAB) 10 MG (21) TBPK tablet Take by mouth daily. Take 6 tabs by mouth daily  for 2 days, then 5 tabs for 2 days, then 4 tabs for 2 days, then 3 tabs for 2 days, 2 tabs for 2 days, then 1 tab by mouth daily for 2 days 04/12/24  Yes Teddy Sharper, FNP  aspirin 81 MG chewable tablet Chew by mouth daily.    [provider]  atorvastatin (LIPITOR) 40 MG tablet Take 40 mg by mouth at bedtime. 08/04/21   [provider]  baclofen  (LIORESAL ) 10 MG tablet Take 1 tablet (10 mg total) by mouth 3 (three) times daily. 09/29/22   Colette Torrence GRADE, MD  carbamide peroxide (DEBROX) 6.5 % OTIC solution Place 5 drops into both ears 2 (two) times daily. 07/19/20   Phelps, Erin O, PA-C  celecoxib  (CELEBREX ) 200 MG capsule TAKE 1 CAPSULE BY MOUTH TWICE DAILY . APPOINTMENT REQUIRED FOR FUTURE REFILLS 02/21/24   Curtis Debby PARAS, MD  colchicine  0.6 MG tablet Take 1 tablet (0.6 mg total) by mouth daily. 10/18/23   Curtis Debby PARAS, MD  fexofenadine  (ALLEGRA  ALLERGY) 180 MG tablet Take 1 tablet (180 mg total) by mouth daily for 15 days. Patient not taking: Reported on 03/20/2024 10/02/21 10/17/21  Teddy Sharper, FNP  Levocetirizine Dihydrochloride (XYZAL PO) Take by mouth.    [provider]  lidocaine  (LIDODERM ) 5 % Place 1 patch onto the skin every 12 (twelve) hours. Remove & Discard patch within 12 hours or as directed by MD 09/29/22   Colette Torrence GRADE, MD  lisinopril (PRINIVIL,ZESTRIL) 20 MG tablet Take 20 mg by mouth daily.    [provider]  metoprolol succinate (TOPROL-XL) 50 MG 24 hr tablet Take 50 mg by mouth daily. Take with or immediately following a meal.    [provider]  ondansetron  (ZOFRAN ) 4 MG tablet Take 1 tablet (4 mg total) by  mouth every 8 (eight) hours as needed for nausea or vomiting. 10/18/21   Sikora, Rebecca, DPM  pantoprazole (PROTONIX) 40 MG tablet Take 40 mg by mouth daily. 06/15/21   [provider]    Family History Family History  Problem Relation Age of Onset   Osteoarthritis Mother    Parkinson's disease Father    Dementia Father     Social History Social History   Tobacco Use   Smoking status: Every Day    Current packs/day: 1.00    Average packs/day: 1 pack/day for 35.0 years (35.0 ttl pk-yrs)    Types: Cigarettes   Smokeless tobacco: Never  Substance Use Topics   Alcohol use: Yes    Comment: 1 pint of whiskey per day    Drug use: Yes    Types: Marijuana    Comment: 14 grams per week     Allergies   Penicillins   Review of Systems Review of Systems  Musculoskeletal:  Positive for back pain.     Physical Exam Triage Vital Signs ED Triage Vitals  Encounter Vitals Group     BP 04/12/24 1127 (!) 150/110     Girls Systolic BP Percentile --      Girls Diastolic BP Percentile --      Boys Systolic BP Percentile --      Boys Diastolic BP Percentile --      Pulse Rate 04/12/24 1127 95     Resp 04/12/24 1127 17     Temp 04/12/24 1127 98.1 F (36.7 C)     Temp Source 04/12/24 1127 Oral     SpO2 04/12/24 1127 98 %     Weight --      Height --      Head Circumference --      Peak Flow --      Pain Score 04/12/24 1128 3     Pain Loc --      Pain Education --      Exclude from Growth Chart --    No data found.  Updated Vital Signs BP (!) 150/110 (BP Location: Right Arm)   Pulse 95   Temp 98.1 F (36.7 C) (Oral)   Resp 17   SpO2 98%    Physical Exam Vitals and nursing note reviewed.  Constitutional:      Appearance: Normal appearance. He is normal weight.  HENT:     Head: Normocephalic and atraumatic.     Mouth/Throat:     Mouth: Mucous membranes are moist.     Pharynx: Oropharynx is clear.  Eyes:     Extraocular Movements: Extraocular movements intact.     Conjunctiva/sclera: Conjunctivae normal.     Pupils: Pupils are equal, round, and reactive to light.  Cardiovascular:     Rate and Rhythm: Normal rate and regular rhythm.     Pulses: Normal pulses.     Heart sounds: Normal heart sounds.  Pulmonary:     Effort: Pulmonary effort is normal.     Breath sounds: Normal breath sounds. No wheezing, rhonchi or rales.  Musculoskeletal:        General: Normal range of motion.     Comments: Patient reporting midline low back pain with radiating pain through bilateral glutes muscles down posterior leg  Skin:    General: Skin is warm and dry.  Neurological:     General: No focal  deficit present.     Mental Status: He is alert and oriented to person, place, and time. Mental  status is at baseline.  Psychiatric:        Mood and Affect: Mood normal.        Behavior: Behavior normal.      UC Treatments / Results  Labs (all labs ordered are listed, but only abnormal results are displayed) Labs Reviewed - No data to display  EKG   Radiology No results found.  Procedures Procedures (including critical Caldwell time)  Medications Ordered in UC Medications  methylPREDNISolone  acetate (DEPO-MEDROL ) injection 80 mg (80 mg Intramuscular Given 04/12/24 1217)    Initial Impression / Assessment and Plan / UC Course  I have reviewed the triage vital signs and the nursing notes.  Pertinent labs & imaging results that were available during my Caldwell of the patient were reviewed by me and considered in my medical decision making (see chart for details).     MDM: 1.  Acute midline low back pain with bilateral sciatica-IM Depo-Medrol  80 mg given once in clinic, Rx'd Sterapred Unipak (42 tab 10 mg taper); 2.  Muscle spasm of back-Rx'd Robaxin  500 mg tablet: Take 1 tablet 3 times daily, as needed for back spasms.  Advised patient to take medication as directed with food completion.  Advised may take Robaxin  daily or as needed for muscle spasms of back.  Encouraged to increase daily water intake to 64 ounces per day while taking this medication.  Advised if symptoms worsen and/or unresolved please follow-up with your PCP or here for further evaluation.  Patient discharged home, hemodynamically stable. Final Clinical Impressions(s) / UC Diagnoses   Final diagnoses:  Acute midline low back pain with bilateral sciatica  Muscle spasm of back     Discharge Instructions      Advised patient to take medication as directed with food completion.  Advised may take Robaxin  daily or as needed for muscle spasms of back.  Encouraged to increase daily water intake to 64 ounces per day while  taking this medication.  Advised if symptoms worsen and/or unresolved please follow-up with your PCP or here for further evaluation.     ED Prescriptions     Medication Sig Dispense Auth. Provider   predniSONE  (STERAPRED UNI-PAK 21 TAB) 10 MG (21) TBPK tablet Take by mouth daily. Take 6 tabs by mouth daily  for 2 days, then 5 tabs for 2 days, then 4 tabs for 2 days, then 3 tabs for 2 days, 2 tabs for 2 days, then 1 tab by mouth daily for 2 days 42 tablet Teddy Sharper, FNP   methocarbamol  (ROBAXIN ) 500 MG tablet Take 1 tablet (500 mg total) by mouth 3 (three) times daily as needed for up to 7 days. 21 tablet Nysir Fergusson, FNP      PDMP not reviewed this encounter.   Teddy Sharper, FNP 04/12/24 1228

## 2024-04-16 NOTE — Telephone Encounter (Signed)
Attempted call to patient . Phone rang without answer. Could not leave a voice mail message.

## 2024-04-18 NOTE — Telephone Encounter (Signed)
 Attempted call again to patient. Mail box is full and can not accept any messages. Could not leave a voice mail message.

## 2024-04-18 NOTE — Telephone Encounter (Signed)
 Patient shows schld for 05/01/2024  with Dr. Curtis

## 2024-05-01 ENCOUNTER — Ambulatory Visit: Admitting: Sports Medicine

## 2024-05-01 DIAGNOSIS — M47816 Spondylosis without myelopathy or radiculopathy, lumbar region: Secondary | ICD-10-CM

## 2024-05-01 MED ORDER — NAPROXEN 500 MG PO TABS: 500.0000 mg | ORAL_TABLET | Freq: Two times a day (BID) | ORAL | 3 refills | Status: DC

## 2024-05-01 MED ORDER — GABAPENTIN 300 MG PO CAPS: ORAL_CAPSULE | ORAL | 3 refills | Status: AC

## 2024-05-01 NOTE — Progress Notes (Signed)
    Procedures performed today:    None.  Independent interpretation of notes and tests performed by another provider:   None.  Brief History, Exam, Impression, and Recommendations:    Lumbar spondylosis My level lumbar spondylosis, increasing back pain, he has had a couple courses of prednisone  now doing well, I advised we should limit prednisone  to a few times a year at the most. He will try some naproxen , discontinue Celebrex . Adding gabapentin  as well for breakthrough pain on top of naproxen . Home PT given, return to see me as needed.    ____________________________________________ Debby PARAS. Curtis, M.D., ABFM., CAQSM., AME. Primary Care and Sports Medicine [facility]  Adjunct Professor of [facility] Medicine  [employer]  Restaurant manager, fast food

## 2024-05-01 NOTE — Assessment & Plan Note (Signed)
 My level lumbar spondylosis, increasing back pain, he has had a couple courses of prednisone  now doing well, I advised we should limit prednisone  to a few times a year at the most. He will try some naproxen , discontinue Celebrex . Adding gabapentin  as well for breakthrough pain on top of naproxen . Home PT given, return to see me as needed.

## 2024-05-14 ENCOUNTER — Other Ambulatory Visit: Payer: Self-pay | Admitting: Sports Medicine

## 2024-05-14 DIAGNOSIS — M11269 Other chondrocalcinosis, unspecified knee: Secondary | ICD-10-CM

## 2024-05-14 DIAGNOSIS — M1711 Unilateral primary osteoarthritis, right knee: Secondary | ICD-10-CM

## 2024-05-23 ENCOUNTER — Telehealth: Payer: Self-pay | Admitting: Family Medicine

## 2024-05-23 NOTE — Telephone Encounter (Signed)
 We will have to submit a new benefits investigation through his insurance in order to begin the series. His last approval was back in 2023.

## 2024-05-23 NOTE — Telephone Encounter (Signed)
 Patient called. He wants to get another OrthoVisc inj.  Please advise. *Please respond to [employer] if you are unable to take care of this today.

## 2024-05-27 NOTE — Telephone Encounter (Signed)
 See if pt would like to be scheduled with Dr. Delfina in [facility] who can do orthovisc for him

## 2024-06-03 ENCOUNTER — Encounter: Payer: Self-pay | Admitting: Sports Medicine

## 2024-06-04 ENCOUNTER — Other Ambulatory Visit: Payer: Self-pay

## 2024-06-04 ENCOUNTER — Ambulatory Visit

## 2024-06-04 VITALS — BP 148/96 | Ht 74.0 in | Wt 263.0 lb

## 2024-06-04 DIAGNOSIS — M11269 Other chondrocalcinosis, unspecified knee: Secondary | ICD-10-CM | POA: Diagnosis not present

## 2024-06-04 DIAGNOSIS — M1712 Unilateral primary osteoarthritis, left knee: Secondary | ICD-10-CM

## 2024-06-04 DIAGNOSIS — M1711 Unilateral primary osteoarthritis, right knee: Secondary | ICD-10-CM

## 2024-06-04 MED ORDER — HYALURONAN 30 MG/2ML IX SOSY
30.0000 mg | PREFILLED_SYRINGE | Freq: Once | INTRA_ARTICULAR | Status: AC
  Administered 2024-06-04: 30 mg via INTRA_ARTICULAR

## 2024-06-04 NOTE — Progress Notes (Signed)
   Subjective:    Patient ID: Daryl Caldwell, male    DOB: 58 y.o., 1966-04-16   MRN: [id number]  HPI  Chief Complaint: Bilateral Orthovisc #1 injections.  Patient is a prior patient of Dr. Curtis. Had scheduled visit to receive Orthovisc injections in both knees previously but with the sudden and unexpected departure of Dr. Curtis from his practice, the patient was instead scheduled with myself. He reports continued pain in both knees. Carries diagnosis of pseudogout and primary osteoarthritis in both knees.     Objective:   Physical Exam Vitals:   06/04/24 1042 06/04/24 1125  BP: (!) 150/98 (!) 148/96   Left knee: Tenderness to palpation along medial joint line  Right knee: Tenderness to palpation along medial joint line  Intra-articular Knee Hyaluronic Acid Injection/Aspiration with Ultrasound Guidance Procedure Note Daryl Caldwell 1965/10/24 Indications: Pain Procedure Details Following the description of risks including infection bleeding, damage to surrounding structures, patient provided verbal/written consent for left knee injection procedure. Palpated for the superior-lateral joint space and under US  guidance. Patient was sterilely prepped in the usual fashion with chlorhexidine.  Following tract anesthesia with 3cc of Lidocaine  1% buffered with 0.5cc Sodium Bicarbonate 8.4% with a 25 gauge needle, they were injected with visco-supplementation. Patient tolerated well without complication.  Precautions provided.   Intra-articular Knee Hyaluronic Acid Injection/Aspiration with Ultrasound Guidance Procedure Note Daryl Caldwell [birth year] Indications: Pain Procedure Details Following the description of risks including infection bleeding, damage to surrounding structures, patient provided verbal/written consent for right knee injection procedure. Palpated for the superior-lateral joint space and under US  guidance. Patient was sterilely prepped in the usual  fashion with chlorhexidine.  Following tract anesthesia with 3cc of Lidocaine  1% buffered with 0.5cc Sodium Bicarbonate 8.4% with a 25 gauge needle, they were injected with  visco-supplementation. Patient tolerated well without complication.  Precautions provided.      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  He tolerated the injection procedure well.  We will follow-up with him in 1 week to proceed with second injection per initial plan curated by Dr. Curtis.

## 2024-06-05 ENCOUNTER — Telehealth: Payer: Self-pay | Admitting: *Deleted

## 2024-06-05 NOTE — Telephone Encounter (Addendum)
 Verified Orthovisc benefits via [employer].  Copay due at time of visit: $80 Orthovisc copay: 40%  approx $260 Administration copay: 40%  approx $52  Deductible applies. Once deductible is met, patient is responsible for coinsurance. Once OOP max is met, coverage goes to 100%.   Deductible: $3000 (met $142.02) OOP max: $6400 (met $526.04)    PA: Approved under Dr. Ludie Littler eff: 06/11/24 , completed pa form faxed to [facility] @ [phone]

## 2024-06-06 NOTE — Telephone Encounter (Signed)
Left message for patient to call back re: below.  

## 2024-06-06 NOTE — Telephone Encounter (Signed)
 Pt informed of below.

## 2024-06-11 ENCOUNTER — Other Ambulatory Visit: Payer: Self-pay

## 2024-06-11 ENCOUNTER — Encounter: Payer: Self-pay | Admitting: Family Medicine

## 2024-06-11 ENCOUNTER — Ambulatory Visit: Admitting: Family Medicine

## 2024-06-11 VITALS — BP 138/88 | Ht 74.0 in | Wt 263.0 lb

## 2024-06-11 DIAGNOSIS — M1711 Unilateral primary osteoarthritis, right knee: Secondary | ICD-10-CM | POA: Diagnosis not present

## 2024-06-11 DIAGNOSIS — M1712 Unilateral primary osteoarthritis, left knee: Secondary | ICD-10-CM | POA: Diagnosis not present

## 2024-06-11 MED ORDER — HYALURONAN 30 MG/2ML IX SOSY
30.0000 mg | PREFILLED_SYRINGE | Freq: Once | INTRA_ARTICULAR | Status: AC
  Administered 2024-06-11: 30 mg via INTRA_ARTICULAR

## 2024-06-11 MED ORDER — PREDNISONE 10 MG PO TABS: ORAL_TABLET | ORAL | 0 refills | Status: DC

## 2024-06-11 NOTE — Progress Notes (Signed)
 PCP: Beam, Lamar POUR, MD  Subjective:   HPI: Patient is a 58 y.o. male here for bilateral knee arthritis.  Patient returns for his second Orthovisc injections. No adverse effects after last week. He is also reporting return of his sciatic symptoms starting the middle low back going into both legs. He has previously done well with prednisone  Dosepaks with the last 1 being given 2 months ago.  Past Medical History:  Diagnosis Date   Hypercholesteremia    Hypertension     Current Outpatient Medications on File Prior to Visit  Medication Sig Dispense Refill   aspirin 81 MG chewable tablet Chew by mouth daily.     atorvastatin (LIPITOR) 40 MG tablet Take 40 mg by mouth at bedtime.     baclofen  (LIORESAL ) 10 MG tablet Take 1 tablet (10 mg total) by mouth 3 (three) times daily. 30 each 0   carbamide peroxide (DEBROX) 6.5 % OTIC solution Place 5 drops into both ears 2 (two) times daily. 15 mL 0   colchicine  0.6 MG tablet Take 1 tablet (0.6 mg total) by mouth daily. 90 tablet 3   fexofenadine  (ALLEGRA  ALLERGY) 180 MG tablet Take 1 tablet (180 mg total) by mouth daily for 15 days. 15 tablet 0   gabapentin  (NEURONTIN ) 300 MG capsule One tab PO qHS for a week, then BID for a week, then TID. May double weekly to a max of 3,600mg /day 90 capsule 3   Levocetirizine Dihydrochloride (XYZAL PO) Take by mouth.     lidocaine  (LIDODERM ) 5 % Place 1 patch onto the skin every 12 (twelve) hours. Remove & Discard patch within 12 hours or as directed by MD 10 patch 0   lisinopril (PRINIVIL,ZESTRIL) 20 MG tablet Take 20 mg by mouth daily.     metoprolol succinate (TOPROL-XL) 50 MG 24 hr tablet Take 50 mg by mouth daily. Take with or immediately following a meal.     naproxen  (NAPROSYN ) 500 MG tablet Take 1 tablet (500 mg total) by mouth 2 (two) times daily with a meal. 60 tablet 3   ondansetron  (ZOFRAN ) 4 MG tablet Take 1 tablet (4 mg total) by mouth every 8 (eight) hours as needed for nausea or vomiting. 20  tablet 0   pantoprazole (PROTONIX) 40 MG tablet Take 40 mg by mouth daily.     No current facility-administered medications on file prior to visit.    History reviewed. No pertinent surgical history.  Allergies  Allergen Reactions   Penicillins Itching    BP 138/88   Ht 6' 2 (1.88 m)   Wt 263 lb (119.3 kg)   BMI 33.77 kg/m       No data to display              No data to display              Objective:  Physical Exam:  Gen: NAD, comfortable in exam room   Assessment & Plan:  1.  Bilateral knee arthritis: Second Orthovisc injections given today.  He will follow-up in 1 week for third injections.  After informed written consent timeout was performed.  Patient was lying supine on the exam table.  Area prepped with alcohol swabs.  Then utilizing ultrasound guidance, patient's left knee was injected with 3 mL lidocaine  followed by Orthovisc.  Patient tolerated the procedure well without immediate complications.  After informed written consent timeout was performed.  Patient was lying supine on the exam table.  Area prepped with alcohol swabs.  Then utilizing ultrasound guidance, patient's right knee was injected with 3 mL lidocaine  followed by Orthovisc.  Patient tolerated the procedure well without immediate complications.  2.  Sciatica: Patient reports symptoms into both legs which would suggest spinal stenosis.  Advised him to follow-up for a dedicated visit to evaluate this.  In meantime prescribed 6-day dose pack of prednisone .

## 2024-06-11 NOTE — Patient Instructions (Signed)
 Take the prednisone  dose pack one more time for your back/sciatica. I would recommend making an appointment specifically for this outside the gel injections (or mention you want to review both at your next appointment when scheduling if there's a 30+ minute appointment slot available). Follow up in 1 week for your third gel injection.

## 2024-06-18 ENCOUNTER — Encounter: Payer: Self-pay | Admitting: Family Medicine

## 2024-06-18 ENCOUNTER — Ambulatory Visit: Admitting: Family Medicine

## 2024-06-18 ENCOUNTER — Other Ambulatory Visit: Payer: Self-pay

## 2024-06-18 VITALS — BP 134/92 | Ht 74.0 in | Wt 263.0 lb

## 2024-06-18 DIAGNOSIS — M1712 Unilateral primary osteoarthritis, left knee: Secondary | ICD-10-CM

## 2024-06-18 DIAGNOSIS — M1711 Unilateral primary osteoarthritis, right knee: Secondary | ICD-10-CM

## 2024-06-18 MED ORDER — HYALURONAN 30 MG/2ML IX SOSY
30.0000 mg | PREFILLED_SYRINGE | Freq: Once | INTRA_ARTICULAR | Status: AC
  Administered 2024-06-18: 30 mg via INTRA_ARTICULAR

## 2024-06-18 MED ORDER — HYALURONAN 30 MG/2ML IX SOSY
30.0000 mg | PREFILLED_SYRINGE | Freq: Once | INTRA_ARTICULAR | Status: AC
Start: 2024-06-18 — End: 2024-06-18
  Administered 2024-06-18: 30 mg via INTRA_ARTICULAR

## 2024-06-18 NOTE — Progress Notes (Signed)
 PCP: Beam, Lamar POUR, MD  Subjective:   HPI: Patient is a 58 y.o. male here for bilateral orthovisc injections.  Patient reports he is improving and no side effects from injections last week. Today getting his third injections.  Past Medical History:  Diagnosis Date   Hypercholesteremia    Hypertension     Current Outpatient Medications on File Prior to Visit  Medication Sig Dispense Refill   aspirin 81 MG chewable tablet Chew by mouth daily.     atorvastatin (LIPITOR) 40 MG tablet Take 40 mg by mouth at bedtime.     baclofen  (LIORESAL ) 10 MG tablet Take 1 tablet (10 mg total) by mouth 3 (three) times daily. 30 each 0   carbamide peroxide (DEBROX) 6.5 % OTIC solution Place 5 drops into both ears 2 (two) times daily. 15 mL 0   colchicine  0.6 MG tablet Take 1 tablet (0.6 mg total) by mouth daily. 90 tablet 3   fexofenadine  (ALLEGRA  ALLERGY) 180 MG tablet Take 1 tablet (180 mg total) by mouth daily for 15 days. 15 tablet 0   gabapentin  (NEURONTIN ) 300 MG capsule One tab PO qHS for a week, then BID for a week, then TID. May double weekly to a max of 3,600mg /day 90 capsule 3   Levocetirizine Dihydrochloride (XYZAL PO) Take by mouth.     lidocaine  (LIDODERM ) 5 % Place 1 patch onto the skin every 12 (twelve) hours. Remove & Discard patch within 12 hours or as directed by MD 10 patch 0   lisinopril (PRINIVIL,ZESTRIL) 20 MG tablet Take 20 mg by mouth daily.     metoprolol succinate (TOPROL-XL) 50 MG 24 hr tablet Take 50 mg by mouth daily. Take with or immediately following a meal.     naproxen  (NAPROSYN ) 500 MG tablet Take 1 tablet (500 mg total) by mouth 2 (two) times daily with a meal. 60 tablet 3   ondansetron  (ZOFRAN ) 4 MG tablet Take 1 tablet (4 mg total) by mouth every 8 (eight) hours as needed for nausea or vomiting. 20 tablet 0   pantoprazole (PROTONIX) 40 MG tablet Take 40 mg by mouth daily.     predniSONE  (DELTASONE ) 10 MG tablet 6 tabs po day 1, 5 tabs po day 2, 4 tabs po day 3, 3  tabs po day 4, 2 tabs po day 5, 1 tab po day 6 21 tablet 0   No current facility-administered medications on file prior to visit.    History reviewed. No pertinent surgical history.  Allergies  Allergen Reactions   Penicillins Itching    BP (!) 134/92   Ht 6' 2 (1.88 m)   Wt 263 lb (119.3 kg)   BMI 33.77 kg/m       No data to display              No data to display              Objective:  Physical Exam:  Gen: NAD, comfortable in exam room   Assessment & Plan:  1. Bilateral knee arthritis - orthovisc injections given as below.  Follow up in 4 weeks for reevaluation.  After informed written consent timeout was performed, patient was lying supine on exam table. Right knee was prepped with alcohol swabs and utilizing superolateral approach with ultrasound guidance, patient's right knee was injected intraarticularly with 3mL lidocaine  followed by orthovisc. Patient tolerated the procedure well without immediate complications.  After informed written consent timeout was performed, patient was lying supine on exam  table. Left knee was prepped with alcohol swabs and utilizing superolateral approach with ultrasound guidance, patient's left knee was injected intraarticularly with 3mL lidocaine  followed by orthovisc. Patient tolerated the procedure well without immediate complications.

## 2024-06-20 ENCOUNTER — Telehealth: Payer: Self-pay | Admitting: Urgent Care

## 2024-06-20 DIAGNOSIS — M11269 Other chondrocalcinosis, unspecified knee: Secondary | ICD-10-CM

## 2024-06-20 DIAGNOSIS — M1711 Unilateral primary osteoarthritis, right knee: Secondary | ICD-10-CM

## 2024-06-20 NOTE — Telephone Encounter (Signed)
 Copied from CRM #[id number]. Topic: Clinical - Medical Advice >> Jun 20, 2024  2:07 PM Alfonso ORN wrote: Reason for CRM: atient is pt. Of Dr. ONEIDA and have swelling and pain ,due to out of his medication Celecoxib  200mg  cap takes 2 a day   , patient refuses triage nurse

## 2024-06-20 NOTE — Telephone Encounter (Signed)
 Per DPR, left message that since patient had established care with sports medicine in [place], this refill will need to be from them. For question/concerns, please call the office at [phone].

## 2024-06-23 MED ORDER — CELECOXIB 200 MG PO CAPS: 200.0000 mg | ORAL_CAPSULE | Freq: Two times a day (BID) | ORAL | 0 refills | Status: AC

## 2024-06-23 NOTE — Telephone Encounter (Signed)
 Per Dr. Arvell- ok to send Celebrex  rx.  Patient informed to discontinue naproxen  and start Celebrex . Patient states Dr. ONEIDA prescribed both for different things, but I informed him again not to take Celebrex  and naproxen  together. Patient verbalized understanding.  See meds.

## 2024-06-23 NOTE — Addendum Note (Signed)
 Addended by: MARCINE HARLENE SAILOR on: 06/23/2024 09:00 AM   Modules accepted: Orders

## 2024-06-25 ENCOUNTER — Other Ambulatory Visit: Payer: Self-pay

## 2024-06-25 ENCOUNTER — Ambulatory Visit: Admitting: Family Medicine

## 2024-06-25 VITALS — BP 134/98 | Ht 74.0 in | Wt 263.0 lb

## 2024-06-25 DIAGNOSIS — M1712 Unilateral primary osteoarthritis, left knee: Secondary | ICD-10-CM

## 2024-06-25 DIAGNOSIS — M1711 Unilateral primary osteoarthritis, right knee: Secondary | ICD-10-CM | POA: Diagnosis not present

## 2024-06-25 MED ORDER — HYALURONAN 30 MG/2ML IX SOSY
30.0000 mg | PREFILLED_SYRINGE | Freq: Once | INTRA_ARTICULAR | Status: AC
  Administered 2024-06-25: 30 mg via INTRA_ARTICULAR

## 2024-06-25 NOTE — Progress Notes (Signed)
 PCP: Beam, Lamar POUR, MD  Subjective:   HPI: Patient is a 58 y.o. male here for bilateral orthovisc injections.  Patient returns for 4th orthovisc injections into bilateral knees. Overall doing well without side effects.  Past Medical History:  Diagnosis Date   Hypercholesteremia    Hypertension     Current Outpatient Medications on File Prior to Visit  Medication Sig Dispense Refill   aspirin 81 MG chewable tablet Chew by mouth daily.     atorvastatin (LIPITOR) 40 MG tablet Take 40 mg by mouth at bedtime.     baclofen  (LIORESAL ) 10 MG tablet Take 1 tablet (10 mg total) by mouth 3 (three) times daily. 30 each 0   carbamide peroxide (DEBROX) 6.5 % OTIC solution Place 5 drops into both ears 2 (two) times daily. 15 mL 0   celecoxib  (CELEBREX ) 200 MG capsule Take 1 capsule (200 mg total) by mouth 2 (two) times daily. 180 capsule 0   colchicine  0.6 MG tablet Take 1 tablet (0.6 mg total) by mouth daily. 90 tablet 3   fexofenadine  (ALLEGRA  ALLERGY) 180 MG tablet Take 1 tablet (180 mg total) by mouth daily for 15 days. 15 tablet 0   gabapentin  (NEURONTIN ) 300 MG capsule One tab PO qHS for a week, then BID for a week, then TID. May double weekly to a max of 3,600mg /day 90 capsule 3   Levocetirizine Dihydrochloride (XYZAL PO) Take by mouth.     lidocaine  (LIDODERM ) 5 % Place 1 patch onto the skin every 12 (twelve) hours. Remove & Discard patch within 12 hours or as directed by MD 10 patch 0   lisinopril (PRINIVIL,ZESTRIL) 20 MG tablet Take 20 mg by mouth daily.     metoprolol succinate (TOPROL-XL) 50 MG 24 hr tablet Take 50 mg by mouth daily. Take with or immediately following a meal.     ondansetron  (ZOFRAN ) 4 MG tablet Take 1 tablet (4 mg total) by mouth every 8 (eight) hours as needed for nausea or vomiting. 20 tablet 0   pantoprazole (PROTONIX) 40 MG tablet Take 40 mg by mouth daily.     predniSONE  (DELTASONE ) 10 MG tablet 6 tabs po day 1, 5 tabs po day 2, 4 tabs po day 3, 3 tabs po day 4, 2  tabs po day 5, 1 tab po day 6 21 tablet 0   No current facility-administered medications on file prior to visit.    No past surgical history on file.  Allergies  Allergen Reactions   Penicillins Itching    BP (!) 134/98   Ht 6' 2 (1.88 m)   Wt 263 lb (119.3 kg)   BMI 33.77 kg/m       No data to display              No data to display              Objective:  Physical Exam:  Gen: NAD, comfortable in exam room  Knee exam not repeated today   Assessment & Plan:  1. Bilateral knee arthritis - 4th orthovisc injections given today.  Follow up as needed.  After informed written consent timeout was performed, patient was lying supine on exam table. Right knee was prepped with alcohol swab and utilizing superolateral approach with ultrasound guidance, patient's right knee was injected intraarticularly with 3mL lidocaine  followed by orthovisc. Patient tolerated the procedure well without immediate complications.  After informed written consent timeout was performed, patient was lying supine on exam table. Left knee  was prepped with alcohol swab and utilizing superolateral approach with ultrasound guidance, patient's left knee was injected intraarticularly with 3mL lidocaine  followed by orthovisc. Patient tolerated the procedure well without immediate complications.

## 2024-08-11 ENCOUNTER — Ambulatory Visit
Admission: EM | Admit: 2024-08-11 | Discharge: 2024-08-11 | Disposition: A | Discharge status: Home / Self Care | Attending: Family Medicine | Admitting: Family Medicine

## 2024-08-11 DIAGNOSIS — R21 Rash and other nonspecific skin eruption: Secondary | ICD-10-CM

## 2024-08-11 DIAGNOSIS — T7819XA Other adverse food reactions, not elsewhere classified, initial encounter: Secondary | ICD-10-CM

## 2024-08-11 MED ORDER — METHYLPREDNISOLONE SODIUM SUCC 125 MG IJ SOLR
125.0000 mg | Freq: Once | INTRAMUSCULAR | Status: AC
  Administered 2024-08-11: 125 mg via INTRAMUSCULAR

## 2024-08-11 MED ORDER — PREDNISONE 10 MG (21) PO TBPK: ORAL_TABLET | Freq: Every day | ORAL | 0 refills | Status: AC

## 2024-08-11 NOTE — ED Triage Notes (Signed)
 Pt c/o itching all over since 3am. States it may be due to eating oysters, however he eats oysters frequently. Denies changes to soaps or detergents. No OTC meds tried.

## 2024-08-11 NOTE — ED Provider Notes (Signed)
 [facility]    CSN: [id number] Arrival date & time: 08/11/24  0919      History   Chief Complaint Chief Complaint  Patient presents with   Pruritis    HPI Daryl Caldwell is a 58 y.o. male.   HPI pleasant 58 year old male presents with itching all over since 3 AM.  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  PMH significant for obesity, hypercholesterolemia, and HTN.  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 No pertinent surgical history.     Home Medications    Prior to Admission medications   Medication Sig Start Date End Date Taking? Authorizing Provider  predniSONE  (STERAPRED UNI-PAK 21 TAB) 10 MG (21) TBPK tablet Take by mouth daily. Take 6 tabs by mouth daily  for 2 days, then 5 tabs for 2 days, then 4 tabs for 2 days, then 3 tabs for 2 days, 2 tabs for 2 days, then 1 tab by mouth daily for 2 days 08/11/24  Yes Teddy Sharper, FNP  aspirin 81 MG chewable tablet Chew by mouth daily.    [provider]  atorvastatin (LIPITOR) 40 MG tablet Take 40 mg by mouth at bedtime. 08/04/21   [provider]  baclofen  (LIORESAL ) 10 MG tablet Take  1 tablet (10 mg total) by mouth 3 (three) times daily. 09/29/22   Colette Torrence GRADE, MD  carbamide peroxide (DEBROX) 6.5 % OTIC solution Place 5 drops into both ears 2 (two) times daily. 07/19/20   Anitra Rocky KIDD, PA-C  celecoxib  (CELEBREX ) 200 MG capsule Take 1 capsule (200 mg total) by mouth 2 (two) times daily. 06/23/24   Arvell Evalene SAUNDERS, DO  colchicine  0.6 MG tablet Take 1 tablet (0.6 mg total) by mouth daily. 10/18/23   Curtis Debby PARAS, MD  fexofenadine  (ALLEGRA  ALLERGY) 180 MG tablet Take 1 tablet (180 mg total) by mouth daily for 15 days. 10/02/21 05/01/24  Teddy Sharper, FNP  gabapentin  (NEURONTIN ) 300 MG capsule One tab PO qHS for a week, then BID for a week, then TID. May double weekly to a max of 3,600mg /day 05/01/24   Curtis Debby PARAS, MD  Levocetirizine Dihydrochloride (XYZAL PO) Take by mouth.    [provider]  lidocaine  (LIDODERM ) 5 % Place 1 patch onto the skin every 12 (twelve) hours. Remove & Discard patch within 12 hours or as directed by MD 09/29/22   Colette Torrence GRADE, MD  lisinopril (PRINIVIL,ZESTRIL) 20 MG tablet Take 20 mg by mouth daily.    [provider]  metoprolol succinate (TOPROL-XL) 50 MG 24 hr tablet Take 50 mg by mouth daily. Take with or immediately following a meal.  [provider]  ondansetron  (ZOFRAN ) 4 MG tablet Take 1 tablet (4 mg total) by mouth every 8 (eight) hours as needed for nausea or vomiting. 10/18/21   Sikora, Rebecca, DPM  pantoprazole (PROTONIX) 40 MG tablet Take 40 mg by mouth daily. 06/15/21   [provider]    Family History Family History  Problem Relation Age of Onset   Osteoarthritis Mother    Parkinson's disease Father    Dementia Father     Social History Social History   Tobacco Use   Smoking status: Every Day    Current packs/day: 1.00    Average packs/day: 1 pack/day for 35.0 years (35.0 ttl pk-yrs)    Types: Cigarettes   Smokeless tobacco: Never  Substance Use Topics    Alcohol use: Yes    Comment: 1 pint of whiskey per day   Drug use: Yes    Types: Marijuana    Comment: 14 grams per week     Allergies   Penicillins   Review of Systems Review of Systems  Skin:  Positive for rash.  All other systems reviewed and are negative.    Physical Exam Triage Vital Signs ED Triage Vitals [08/11/24 0939]  Encounter Vitals Group     BP (!) 178/126     Girls Systolic BP Percentile      Girls Diastolic BP Percentile      Boys Systolic BP Percentile      Boys Diastolic BP Percentile      Pulse Rate 90     Resp 17     Temp 97.8 F (36.6 C)     Temp Source Oral     SpO2 97 %     Weight      Height      Head Circumference      Peak Flow      Pain Score 0     Pain Loc      Pain Education      Exclude from Growth Chart    No data found.  Updated Vital Signs BP (!) 178/126 (BP Location: Right Arm)   Pulse 90   Temp 97.8 F (36.6 C) (Oral)   Resp 17   SpO2 97%   Physical Exam Vitals and nursing note reviewed.  Constitutional:      Appearance: Normal appearance. He is normal weight.  HENT:     Head: Normocephalic and atraumatic.     Mouth/Throat:     Mouth: Mucous membranes are moist.     Pharynx: Oropharynx is clear.  Eyes:     Extraocular Movements: Extraocular movements intact.     Conjunctiva/sclera: Conjunctivae normal.     Pupils: Pupils are equal, round, and reactive to light.  Cardiovascular:     Rate and Rhythm: Normal rate and regular rhythm.     Heart sounds: Normal heart sounds.  Pulmonary:     Effort: Pulmonary effort is normal.     Breath sounds: Normal breath sounds. No wheezing, rhonchi or rales.  Musculoskeletal:        General: Normal range of motion.  Skin:    General: Skin is warm and dry.     Comments: Face (right sided/inferior orbit area), upper chest/clavicle region/bilateral shoulders: Pruritic, erythematous, maculopapular eruption please see images below  Neurological:     General: No focal deficit  present.     Mental Status: He is alert and oriented to person, place, and time.  Psychiatric:  Mood and Affect: Mood normal.        Behavior: Behavior normal.           UC Treatments / Results  Labs (all labs ordered are listed, but only abnormal results are displayed) Labs Reviewed - No data to display  EKG   Radiology No results found.  Procedures Procedures (including critical care time)  Medications Ordered in UC Medications  methylPREDNISolone  sodium succinate (SOLU-MEDROL ) 125 mg/2 mL injection 125 mg (125 mg Intramuscular Given 08/11/24 1017)    Initial Impression / Assessment and Plan / UC Course  I have reviewed the triage vital signs and the nursing notes.  Pertinent labs & imaging results that were available during my care of the patient were reviewed by me and considered in my medical decision making (see chart for details).     MDM: 1.  Rash and nonspecific skin eruption-IM Solu-Medrol  125 mg given once in clinic and prior to discharge; 2.  Allergic reaction to food, initial encounter-Rx'd Sterapred Unipak (42 tab 10 mg taper): Take as directed. Patient take medication as directed with food to completion.  Encouraged to increase daily water intake to 64 ounces per day while taking this medication.  Advised if symptoms worsen and/or unresolved please follow-up with your PCP or here for further evaluation.  Patient discharged home, hemodynamically stable. Final Clinical Impressions(s) / UC Diagnoses   Final diagnoses:  Rash and nonspecific skin eruption  Allergic reaction to food, initial encounter     Discharge Instructions      Patient take medication as directed with food to completion.  Encouraged to increase daily water intake to 64 ounces per day while taking this medication.  Advised if symptoms worsen and/or unresolved please follow-up with your PCP or here for further evaluation.     ED Prescriptions     Medication Sig Dispense  Auth. Provider   predniSONE  (STERAPRED UNI-PAK 21 TAB) 10 MG (21) TBPK tablet Take by mouth daily. Take 6 tabs by mouth daily  for 2 days, then 5 tabs for 2 days, then 4 tabs for 2 days, then 3 tabs for 2 days, 2 tabs for 2 days, then 1 tab by mouth daily for 2 days 42 tablet Teddy Sharper, FNP      PDMP not reviewed this encounter.   Teddy Sharper, FNP 08/11/24 1029

## 2024-08-11 NOTE — Discharge Instructions (Addendum)
 Patient take medication as directed with food to completion.  Encouraged to increase daily water intake to 64 ounces per day while taking this medication.  Advised if symptoms worsen and/or unresolved please follow-up with your PCP or here for further evaluation.

## 2024-11-03 NOTE — Progress Notes (Signed)
 Daryl Caldwell                                          MRN: [id number]   11/03/2024   The [facility] Quality Team Specialist reviewed this patient medical record for the purposes of chart review for care gap closure. The following were reviewed: chart review for care gap closure-colorectal cancer screening.    [facility] Quality Team
